# Patient Record
Sex: Male | Born: 1970 | Race: White | Hispanic: No | Marital: Married | Smoking: Current every day smoker
Health system: Southern US, Community
[De-identification: ages and names within clinical notes are randomized; demographics above are authoritative.]

## PROBLEM LIST (undated history)

## (undated) DIAGNOSIS — L0390 Cellulitis, unspecified: Secondary | ICD-10-CM

## (undated) DIAGNOSIS — T7840XA Allergy, unspecified, initial encounter: Secondary | ICD-10-CM

## (undated) DIAGNOSIS — K219 Gastro-esophageal reflux disease without esophagitis: Secondary | ICD-10-CM

## (undated) HISTORY — DX: Gastro-esophageal reflux disease without esophagitis: K21.9

## (undated) HISTORY — PX: ANKLE FRACTURE SURGERY: SHX122

## (undated) HISTORY — PX: JOINT REPLACEMENT: SHX530

## (undated) HISTORY — DX: Allergy, unspecified, initial encounter: T78.40XA

## (undated) HISTORY — PX: WISDOM TOOTH EXTRACTION: SHX21

---

## 1999-03-16 ENCOUNTER — Emergency Department (HOSPITAL_COMMUNITY): Admission: EM | Admit: 1999-03-16 | Discharge: 1999-03-16 | Payer: Self-pay

## 2000-06-20 ENCOUNTER — Encounter: Payer: Self-pay | Admitting: Emergency Medicine

## 2000-06-20 ENCOUNTER — Emergency Department (HOSPITAL_COMMUNITY): Admission: EM | Admit: 2000-06-20 | Discharge: 2000-06-20 | Payer: Self-pay | Admitting: Emergency Medicine

## 2000-09-18 ENCOUNTER — Emergency Department (HOSPITAL_COMMUNITY): Admission: EM | Admit: 2000-09-18 | Discharge: 2000-09-18 | Payer: Self-pay

## 2003-11-25 ENCOUNTER — Emergency Department (HOSPITAL_COMMUNITY): Admission: EM | Admit: 2003-11-25 | Discharge: 2003-11-25 | Payer: Self-pay | Admitting: Emergency Medicine

## 2006-08-12 ENCOUNTER — Emergency Department (HOSPITAL_COMMUNITY): Admission: EM | Admit: 2006-08-12 | Discharge: 2006-08-12 | Payer: Self-pay | Admitting: Family Medicine

## 2010-04-18 ENCOUNTER — Inpatient Hospital Stay (HOSPITAL_COMMUNITY): Admission: EM | Admit: 2010-04-18 | Discharge: 2010-04-19 | Payer: Self-pay | Admitting: Emergency Medicine

## 2010-06-11 ENCOUNTER — Encounter: Admission: RE | Admit: 2010-06-11 | Discharge: 2010-07-15 | Payer: Self-pay | Admitting: Specialist

## 2010-06-12 ENCOUNTER — Emergency Department (HOSPITAL_COMMUNITY): Admission: EM | Admit: 2010-06-12 | Discharge: 2010-06-12 | Payer: Self-pay | Admitting: Emergency Medicine

## 2010-10-16 ENCOUNTER — Emergency Department (HOSPITAL_COMMUNITY)
Admission: EM | Admit: 2010-10-16 | Discharge: 2010-10-16 | Payer: Self-pay | Source: Home / Self Care | Admitting: Emergency Medicine

## 2011-02-02 LAB — CBC
MCHC: 34.3 g/dL (ref 30.0–36.0)
RDW: 12.4 % (ref 11.5–15.5)

## 2011-02-02 LAB — DIFFERENTIAL
Basophils Absolute: 0.1 10*3/uL (ref 0.0–0.1)
Basophils Relative: 0 % (ref 0–1)
Neutro Abs: 11 10*3/uL — ABNORMAL HIGH (ref 1.7–7.7)
Neutrophils Relative %: 82 % — ABNORMAL HIGH (ref 43–77)

## 2011-02-02 LAB — POCT I-STAT, CHEM 8
Calcium, Ion: 1.09 mmol/L — ABNORMAL LOW (ref 1.12–1.32)
Chloride: 107 mEq/L (ref 96–112)
HCT: 50 % (ref 39.0–52.0)
Potassium: 4 mEq/L (ref 3.5–5.1)

## 2011-04-08 ENCOUNTER — Emergency Department (HOSPITAL_BASED_OUTPATIENT_CLINIC_OR_DEPARTMENT_OTHER)
Admission: EM | Admit: 2011-04-08 | Discharge: 2011-04-08 | Disposition: A | Discharge status: Home / Self Care | Payer: BC Managed Care – PPO | Attending: Emergency Medicine | Admitting: Emergency Medicine

## 2011-04-08 ENCOUNTER — Emergency Department (INDEPENDENT_AMBULATORY_CARE_PROVIDER_SITE_OTHER): Payer: BC Managed Care – PPO

## 2011-04-08 DIAGNOSIS — M7989 Other specified soft tissue disorders: Secondary | ICD-10-CM

## 2011-04-08 DIAGNOSIS — M25579 Pain in unspecified ankle and joints of unspecified foot: Secondary | ICD-10-CM | POA: Insufficient documentation

## 2011-04-08 DIAGNOSIS — M79609 Pain in unspecified limb: Secondary | ICD-10-CM

## 2011-04-09 ENCOUNTER — Emergency Department (HOSPITAL_BASED_OUTPATIENT_CLINIC_OR_DEPARTMENT_OTHER)
Admission: EM | Admit: 2011-04-09 | Discharge: 2011-04-09 | Disposition: A | Discharge status: Home / Self Care | Payer: BC Managed Care – PPO | Attending: Emergency Medicine | Admitting: Emergency Medicine

## 2011-04-09 DIAGNOSIS — B353 Tinea pedis: Secondary | ICD-10-CM | POA: Insufficient documentation

## 2011-04-09 DIAGNOSIS — L089 Local infection of the skin and subcutaneous tissue, unspecified: Secondary | ICD-10-CM | POA: Insufficient documentation

## 2011-04-09 LAB — GLUCOSE, CAPILLARY: Glucose-Capillary: 115 mg/dL — ABNORMAL HIGH (ref 70–99)

## 2011-04-11 ENCOUNTER — Inpatient Hospital Stay (HOSPITAL_COMMUNITY)
Admission: AD | Admit: 2011-04-11 | Discharge: 2011-04-14 | DRG: 278 | Disposition: A | Discharge status: Home / Self Care | Payer: BC Managed Care – PPO | Source: Other Acute Inpatient Hospital | Attending: Internal Medicine | Admitting: Internal Medicine

## 2011-04-11 ENCOUNTER — Emergency Department (HOSPITAL_BASED_OUTPATIENT_CLINIC_OR_DEPARTMENT_OTHER)
Admission: EM | Admit: 2011-04-11 | Discharge: 2011-04-11 | Disposition: A | Discharge status: Other Acute Inpatient Hospital | Payer: BC Managed Care – PPO | Source: Home / Self Care | Attending: Emergency Medicine | Admitting: Emergency Medicine

## 2011-04-11 DIAGNOSIS — A4901 Methicillin susceptible Staphylococcus aureus infection, unspecified site: Secondary | ICD-10-CM | POA: Diagnosis present

## 2011-04-11 DIAGNOSIS — R1011 Right upper quadrant pain: Secondary | ICD-10-CM | POA: Diagnosis present

## 2011-04-11 DIAGNOSIS — F17200 Nicotine dependence, unspecified, uncomplicated: Secondary | ICD-10-CM | POA: Diagnosis present

## 2011-04-11 DIAGNOSIS — L02619 Cutaneous abscess of unspecified foot: Principal | ICD-10-CM | POA: Diagnosis present

## 2011-04-11 LAB — DIFFERENTIAL
Basophils Absolute: 0.1 10*3/uL (ref 0.0–0.1)
Basophils Relative: 0 % (ref 0–1)
Eosinophils Relative: 1 % (ref 0–5)
Lymphocytes Relative: 11 % — ABNORMAL LOW (ref 12–46)
Neutro Abs: 13.3 10*3/uL — ABNORMAL HIGH (ref 1.7–7.7)

## 2011-04-11 LAB — CBC
HCT: 41.6 % (ref 39.0–52.0)
RDW: 12.2 % (ref 11.5–15.5)
WBC: 16.7 10*3/uL — ABNORMAL HIGH (ref 4.0–10.5)

## 2011-04-11 LAB — HEPATIC FUNCTION PANEL
AST: 24 U/L (ref 0–37)
Albumin: 3.5 g/dL (ref 3.5–5.2)
Total Bilirubin: 0.6 mg/dL (ref 0.3–1.2)
Total Protein: 6.4 g/dL (ref 6.0–8.3)

## 2011-04-11 LAB — BASIC METABOLIC PANEL
CO2: 21 mEq/L (ref 19–32)
Calcium: 10.1 mg/dL (ref 8.4–10.5)
GFR calc Af Amer: >60 mL/min (ref >60)
GFR calc non Af Amer: >60 mL/min (ref >60)
Sodium: 138 mEq/L (ref 135–145)

## 2011-04-11 LAB — SEDIMENTATION RATE: Sed Rate: 15 mm/hr (ref 0–16)

## 2011-04-12 ENCOUNTER — Inpatient Hospital Stay (HOSPITAL_COMMUNITY): Payer: BC Managed Care – PPO

## 2011-04-12 LAB — COMPREHENSIVE METABOLIC PANEL
Alkaline Phosphatase: 85 U/L (ref 39–117)
BUN: 11 mg/dL (ref 6–23)
CO2: 26 mEq/L (ref 19–32)
Calcium: 9 mg/dL (ref 8.4–10.5)
GFR calc non Af Amer: >60 mL/min (ref >60)
Glucose, Bld: 83 mg/dL (ref 70–99)
Total Protein: 6.6 g/dL (ref 6.0–8.3)

## 2011-04-12 LAB — CBC
MCH: 31.3 pg (ref 26.0–34.0)
MCHC: 35.2 g/dL (ref 30.0–36.0)
Platelets: 187 10*3/uL (ref 150–400)
RDW: 12.2 % (ref 11.5–15.5)

## 2011-04-12 LAB — RAPID URINE DRUG SCREEN, HOSP PERFORMED
Amphetamines: NOT DETECTED
Benzodiazepines: NOT DETECTED
Cocaine: NOT DETECTED
Opiates: POSITIVE — AB
Tetrahydrocannabinol: POSITIVE — AB

## 2011-04-12 LAB — PROTIME-INR
INR: 1.02 (ref 0.00–1.49)
Prothrombin Time: 13.6 seconds (ref 11.6–15.2)

## 2011-04-12 MED ORDER — GADOBENATE DIMEGLUMINE 529 MG/ML IV SOLN
15.0000 mL | Freq: Once | INTRAVENOUS | Status: AC | PRN
  Administered 2011-04-12: 15 mL via INTRAVENOUS

## 2011-04-13 LAB — VANCOMYCIN, TROUGH: Vancomycin Tr: 6.2 ug/mL — ABNORMAL LOW (ref 10.0–20.0)

## 2011-04-13 LAB — CBC
HCT: 39.4 % (ref 39.0–52.0)
Hemoglobin: 13.4 g/dL (ref 13.0–17.0)
MCH: 29.8 pg (ref 26.0–34.0)
MCHC: 34 g/dL (ref 30.0–36.0)
RDW: 12 % (ref 11.5–15.5)

## 2011-04-13 LAB — BASIC METABOLIC PANEL
BUN: 10 mg/dL (ref 6–23)
CO2: 27 mEq/L (ref 19–32)
Creatinine, Ser: 0.63 mg/dL (ref 0.4–1.5)
Glucose, Bld: 95 mg/dL (ref 70–99)
Sodium: 136 mEq/L (ref 135–145)

## 2011-04-15 LAB — WOUND CULTURE

## 2011-04-15 NOTE — Consult Note (Signed)
  NAMESAMI, FROH               ACCOUNT NO.:  [id number]  MEDICAL RECORD NO.:  [id number]           PATIENT TYPE:  E  LOCATION:  [facility]                         FACILITY:  [facility]  PHYSICIAN:  [name], M.D.DATE OF BIRTH:  08/30/1971  DATE OF CONSULTATION:  04/12/2011 DATE OF DISCHARGE:  04/11/2011                                CONSULTATION   ADDENDUM: Today Christian Peterson had MRI scan done of his right foot.  I discussed this with Dr. Alcide Clever of  Radiology.  He did describe a small sepsis formation 3rd web space but no foreign body.  I discussed this with the patient, his mother, and his girlfriend who recommended irrigation and debridement and wished to proceed.  PROCEDURE:  Lidocaine 2% anesthetization, Betadine prep irrigation and debridement.  Removed 2-3 cc of frank pus from the 3rd web space and then copious irrigation of the abscess pocket was done.  Gram stain and culture were sent initially and then it was packed with iodoform gauze and dressings applied.  IMPRESSION:  Abscess formation, right 3rd web space with secondary cellulitis.  RECOMMENDATIONS:  Irrigation and debridement as noted above.  Continue IV vancomycin and Zosyn.  Check on Gram stain tomorrow.  He has doxycycline and Keflex at home.  If at that point in time tomorrow he feels much better and his foot looks good, he can go home on p.o. antibiotics and follow with cultures as necessary and change antibiotics in the office if necessary.  He is also recommended to stay out of work and elevate as much as possible.  All questions were encouraged and answered to the patient.  His girlfriend and his mother were with him today.          ______________________________ [name], M.D.     RAC/MEDQ  D:  04/12/2011  T:  04/13/2011  Job:  [id number]  Electronically Signed by Eugenia Mcalpine M.D. on 04/15/2011 01:03:46 PM
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
## 2011-04-17 LAB — CULTURE, BLOOD (ROUTINE X 2)
Culture  Setup Time: 201205261625
Culture: NO GROWTH
Culture: NO GROWTH

## 2011-04-23 NOTE — Discharge Summary (Signed)
Christian Peterson, Christian Peterson               ACCOUNT NO.:  [id number]  MEDICAL RECORD NO.:  [id number]           PATIENT TYPE:  I  LOCATION:  1614                         FACILITY:  [facility]  PHYSICIAN:  [name], MD     DATE OF BIRTH:  02-14-1971  DATE OF ADMISSION:  04/11/2011 DATE OF DISCHARGE:  04/14/2011                              DISCHARGE SUMMARY   PRIMARY CARE PHYSICIAN:  The patient does not have a primary care physician.  The patient was seen in consultation in the hospital by Dr. Thomasena Edis with [facility]  IMAGING STUDIES:  Imaging studies done include: 1. Right foot film which showed no acute process. 2. He had x-ray of the eyes to rule out foreign body which was ruled     out. 3. He had MRI of the right foot which showed a small abscess of the     distal plantar ball of the foot between the third and fourth toes,     surrounding cellulitis was seen.  No foreign body was noted.     Equivocal myositis was seen as well in the dorsal foot musculature.  PROCEDURES:  Included bedside incision and drainage of the above- mentioned abscess done by Dr. Thomasena Edis.  DISCHARGE DIAGNOSES:  Cellulitis and abscess of right foot.  Staph aureus full sensitivity is not fully available yet.  BRIEF HOSPITAL COURSE:  Briefly, this is a 40 year old Caucasian male who does not have any medical problems per se who presented to the hospital with complaints of right foot pain.  He actually presented to the [facility] ED.  This was his third visit to the ED for the same complaint.  The patient was subsequently admitted, started on vancomycin and Zosyn for cellulitis.  However, the pain was out of proportion to the findings, so MRI was done which showed the abscess. This was drained by the bedside by Dr. Thomasena Edis.  The patient has done well over the last 2 days.  His cultures from the wound are growing abundant staph aureus, full sensitivities are not available.  We  could assume that this could be MRSA.  So Dr. Thomasena Edis recommended the patient go home on doxycycline and Keflex which will be prescribed.  The patient may have these medications at home as well.  He has been afebrile.  His white count initially was 04540 and has normalized.  The foot has been dressed.  Instructions have been provided by Dr. Thomasena Edis to the patient. The patient will see the orthopedic physician on Friday morning in his office for further instructions.  DISCHARGE MEDICATIONS:  Discharge medications are as follows: 1. Doxycycline 100 mg p.o. b.i.d. for 10 days. 2. Keflex 500 mg 4 times daily for 10 days. 3. Nicotine patch 14 mg transdermally daily. 4. Oxycodone 5 mg one to 3 tablets q.3 h p.r.n. as needed for pain, 30     tablets prescribed, should hold until Friday when he sees Dr.     Thomasena Edis.  On the day of discharge, the patient is feeling well.  He has been ambulating using the wooden foot and has been tolerating that  quite well, has not required any pain medications since 6 in the morning.  His vital signs are stable.  He is afebrile.  His lungs are clear.  His right foot is covered in a dressing.  Follow up with Dr. Thomasena Edis Friday morning, the patient will call for appointment.  He has been instructed that if the pain gets worse, he develops fever or chills, he needs to seek attention immediately.  DIET:  Regular.  PHYSICAL ACTIVITY:  Increase activity slowly.  He has been given a note for work as well.  Smoking cessation was emphasized to the patient.  [name], MD     GK/MEDQ  D:  04/14/2011  T:  04/14/2011  Job:  [id number]  cc:   [name], M.D. Fax: [phone]  Electronically Signed by [name] MD on 04/23/2011 11:16:58 PM
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
## 2011-11-04 ENCOUNTER — Emergency Department (INDEPENDENT_AMBULATORY_CARE_PROVIDER_SITE_OTHER): Payer: BC Managed Care – PPO

## 2011-11-04 ENCOUNTER — Encounter: Payer: Self-pay | Admitting: *Deleted

## 2011-11-04 ENCOUNTER — Emergency Department (HOSPITAL_BASED_OUTPATIENT_CLINIC_OR_DEPARTMENT_OTHER)
Admission: EM | Admit: 2011-11-04 | Discharge: 2011-11-04 | Disposition: A | Discharge status: Home / Self Care | Payer: BC Managed Care – PPO | Attending: Emergency Medicine | Admitting: Emergency Medicine

## 2011-11-04 DIAGNOSIS — R05 Cough: Secondary | ICD-10-CM

## 2011-11-04 DIAGNOSIS — R059 Cough, unspecified: Secondary | ICD-10-CM | POA: Insufficient documentation

## 2011-11-04 DIAGNOSIS — B349 Viral infection, unspecified: Secondary | ICD-10-CM

## 2011-11-04 DIAGNOSIS — R0989 Other specified symptoms and signs involving the circulatory and respiratory systems: Secondary | ICD-10-CM

## 2011-11-04 MED ORDER — HYDROCOD POLST-CHLORPHEN POLST 10-8 MG/5ML PO LQCR: 5.0000 mL | Freq: Two times a day (BID) | ORAL | Status: DC | PRN

## 2011-11-04 NOTE — ED Provider Notes (Addendum)
History     CSN: [id number] Arrival date & time: 11/04/2011  2:20 PM   First MD Initiated Contact with Patient 11/04/11 1424      Chief Complaint  Patient presents with  . Influenza    (Consider location/radiation/quality/duration/timing/severity/associated sxs/prior treatment) Patient is a 40 y.o. male presenting with flu symptoms. The history is provided by the patient. No language interpreter was used.  Influenza This is a new problem. The current episode started yesterday. The problem occurs constantly. The problem has been unchanged. Associated symptoms include coughing, a fever, headaches, myalgias and a sore throat. Pertinent negatives include no vomiting. The symptoms are aggravated by nothing. He has tried NSAIDs and acetaminophen for the symptoms. The treatment provided mild relief.    History reviewed. No pertinent past medical history.  Past Surgical History  Procedure Date  . Joint replacement     History reviewed. No pertinent family history.  History  Substance Use Topics  . Smoking status: Current Everyday Smoker -- 1.0 packs/day  . Smokeless tobacco: Not on file  . Alcohol Use: Yes      Review of Systems  Constitutional: Positive for fever.  HENT: Positive for sore throat.   Respiratory: Positive for cough.   Gastrointestinal: Negative for vomiting.  Musculoskeletal: Positive for myalgias.  Neurological: Positive for headaches.  All other systems reviewed and are negative.    Allergies  Review of patient's allergies indicates no known allergies.  Home Medications  No current outpatient prescriptions on file.  BP 132/75  Pulse 105  Temp(Src) 98.7 F (37.1 C) (Oral)  Resp 20  Ht 5\' 8"  (1.727 m)  Wt 150 lb (68.04 kg)  BMI 22.81 kg/m2  SpO2 97%  Physical Exam  Nursing note reviewed. Constitutional: He is oriented to person, place, and time. He appears well-developed and well-nourished.  HENT:  Head: Normocephalic and atraumatic.    Right Ear: External ear normal.  Left Ear: External ear normal.  Mouth/Throat: Posterior oropharyngeal erythema present.  Neck: Normal range of motion. Neck supple.  Cardiovascular: Normal rate and regular rhythm.   Pulmonary/Chest: He has rales.  Musculoskeletal: Normal range of motion.  Neurological: He is alert and oriented to person, place, and time.  Skin: Skin is warm and dry.  Psychiatric: He has a normal mood and affect.    ED Course  Procedures (including critical care time)  Labs Reviewed - No data to display Dg Chest 2 View  11/04/2011  *RADIOLOGY REPORT*  Clinical Data: Cough, congestion  CHEST - 2 VIEW  Comparison: None.  Findings: Cardiomediastinal silhouette is unremarkable.  No acute infiltrate or pleural effusion.  No pulmonary edema.  Mild hyperinflation.  Mild degenerative changes lower thoracic spine.  IMPRESSION: No active disease.  Mild hyperinflation.  Original Report Authenticated By: Natasha Mead, M.D.     1. Viral illness       MDM  Likely ili:pt doesn't want tamiflu:will sent home on symptomatic treatment        Teressa Lower, NP 11/04/11 1456  Teressa Lower, NP 11/04/11 1542

## 2011-11-04 NOTE — ED Provider Notes (Signed)
Medical screening examination/treatment/procedure(s) were performed by non-physician practitioner and as supervising physician I was immediately available for consultation/collaboration.   Apolo Cutshaw A. Tajanae Guilbault, MD 11/04/11 1518 

## 2011-11-04 NOTE — ED Notes (Signed)
Pt c/i flu like symptoms x 2 days

## 2011-11-05 NOTE — ED Provider Notes (Signed)
Medical screening examination/treatment/procedure(s) were performed by non-physician practitioner and as supervising physician I was immediately available for consultation/collaboration.   Lilyonna Steidle A. Joeanthony Seeling, MD 11/05/11 0701 

## 2012-06-01 ENCOUNTER — Encounter: Payer: Self-pay | Admitting: Dietician

## 2012-06-01 NOTE — Progress Notes (Signed)
Brief Out-patient Oncology Nutrition Note  Reason: Patient attended breast cancer nutrition class.   I have educated the patient on plant-based diet. I also discussed and provided nutrition handouts and research based evidence on breast cancer nutrition. We discussed nutrition management for symptoms associated with treatment. The class lasted a duration of 1 hour. The patient's asked good questions and were without any further nutrition related questions or concerns. RD contact information was provided. Patient's were instructed to contact RD for future nutrition questions or concerns.   RD available for nutrition needs.   Christian Peterson [phone] 

## 2013-06-25 ENCOUNTER — Encounter (HOSPITAL_BASED_OUTPATIENT_CLINIC_OR_DEPARTMENT_OTHER): Payer: Self-pay | Admitting: *Deleted

## 2013-06-25 ENCOUNTER — Emergency Department (HOSPITAL_BASED_OUTPATIENT_CLINIC_OR_DEPARTMENT_OTHER)
Admission: EM | Admit: 2013-06-25 | Discharge: 2013-06-25 | Disposition: A | Discharge status: Home / Self Care | Payer: Managed Care, Other (non HMO) | Attending: Emergency Medicine | Admitting: Emergency Medicine

## 2013-06-25 DIAGNOSIS — L03119 Cellulitis of unspecified part of limb: Secondary | ICD-10-CM | POA: Insufficient documentation

## 2013-06-25 DIAGNOSIS — R21 Rash and other nonspecific skin eruption: Secondary | ICD-10-CM | POA: Insufficient documentation

## 2013-06-25 DIAGNOSIS — F17200 Nicotine dependence, unspecified, uncomplicated: Secondary | ICD-10-CM | POA: Insufficient documentation

## 2013-06-25 DIAGNOSIS — L02619 Cutaneous abscess of unspecified foot: Secondary | ICD-10-CM | POA: Insufficient documentation

## 2013-06-25 DIAGNOSIS — L02612 Cutaneous abscess of left foot: Secondary | ICD-10-CM

## 2013-06-25 HISTORY — DX: Cellulitis, unspecified: L03.90

## 2013-06-25 MED ORDER — MORPHINE SULFATE 4 MG/ML IJ SOLN
6.0000 mg | Freq: Once | INTRAMUSCULAR | Status: AC
  Administered 2013-06-25: 6 mg via INTRAMUSCULAR

## 2013-06-25 MED ORDER — MORPHINE SULFATE 4 MG/ML IJ SOLN
4.0000 mg | Freq: Once | INTRAMUSCULAR | Status: AC
  Administered 2013-06-25: 4 mg via INTRAVENOUS

## 2013-06-25 MED ORDER — CEPHALEXIN 500 MG PO CAPS: 500.0000 mg | ORAL_CAPSULE | Freq: Four times a day (QID) | ORAL | Status: DC

## 2013-06-25 MED ORDER — OXYCODONE-ACETAMINOPHEN 5-325 MG PO TABS: 1.0000 | ORAL_TABLET | Freq: Four times a day (QID) | ORAL | Status: DC | PRN

## 2013-06-25 MED ORDER — MORPHINE SULFATE 4 MG/ML IJ SOLN
INTRAMUSCULAR | Status: AC
  Administered 2013-06-25: 6 mg via INTRAMUSCULAR
  Filled 2013-06-25: qty 1

## 2013-06-25 MED ORDER — HYDROCODONE-ACETAMINOPHEN 5-325 MG PO TABS: 2.0000 | ORAL_TABLET | ORAL | Status: DC | PRN

## 2013-06-25 MED ORDER — MORPHINE SULFATE 2 MG/ML IJ SOLN
INTRAMUSCULAR | Status: AC
  Filled 2013-06-25: qty 1

## 2013-06-25 MED ORDER — LIDOCAINE HCL (PF) 1 % IJ SOLN
5.0000 mL | Freq: Once | INTRAMUSCULAR | Status: DC
  Filled 2013-06-25: qty 5

## 2013-06-25 MED ORDER — MORPHINE SULFATE 4 MG/ML IJ SOLN
INTRAMUSCULAR | Status: AC
  Filled 2013-06-25: qty 1

## 2013-06-25 NOTE — ED Provider Notes (Signed)
CSN: [id number]     Arrival date & time 06/25/13  1050 History     First MD Initiated Contact with Patient 06/25/13 1127     Chief Complaint  Patient presents with  . Foot Pain   (Consider location/radiation/quality/duration/timing/severity/associated sxs/prior Treatment) HPI Comments: Pain of R foot, worse at base of 4th toe on plantar surface at location of prior abscess formation. No fever, chills, n/v.  No known injury.  Patient is a 42 y.o. male presenting with extremity pain. The history is provided by the patient and a friend. No language interpreter was used.  Extremity Pain This is a new problem. The current episode started 2 days ago. The problem occurs constantly. The problem has been gradually worsening. Pertinent negatives include no chest pain, no abdominal pain, no headaches and no shortness of breath. The symptoms are aggravated by standing and walking. The symptoms are relieved by lying down. He has tried nothing for the symptoms. The treatment provided no relief.    Past Medical History  Diagnosis Date  . Cellulitis     R foot   Past Surgical History  Procedure Laterality Date  . Joint replacement      L ankle   No family history on file. History  Substance Use Topics  . Smoking status: Current Every Day Smoker -- 1.00 packs/day  . Smokeless tobacco: Not on file  . Alcohol Use: Yes    Review of Systems  Constitutional: Negative for fever, activity change, appetite change and fatigue.  HENT: Negative for congestion, facial swelling, rhinorrhea and trouble swallowing.   Eyes: Negative for photophobia and pain.  Respiratory: Negative for cough, chest tightness and shortness of breath.   Cardiovascular: Negative for chest pain and leg swelling.  Gastrointestinal: Negative for nausea, vomiting, abdominal pain, diarrhea and constipation.  Endocrine: Negative for polydipsia and polyuria.  Genitourinary: Negative for dysuria, urgency, decreased urine volume and  difficulty urinating.  Musculoskeletal: Negative for back pain and gait problem.  Skin: Positive for rash (L foot). Negative for color change and wound.  Allergic/Immunologic: Negative for immunocompromised state.  Neurological: Negative for dizziness, facial asymmetry, speech difficulty, weakness, numbness and headaches.  Psychiatric/Behavioral: Negative for confusion, decreased concentration and agitation.    Allergies  Review of patient's allergies indicates no known allergies.  Home Medications   Current Outpatient Rx  Name  Route  Sig  Dispense  Refill  . cephALEXin (KEFLEX) 500 MG capsule   Oral   Take 1 capsule (500 mg total) by mouth 4 (four) times daily.   28 capsule   0   . chlorpheniramine-HYDROcodone (TUSSIONEX PENNKINETIC ER) 10-8 MG/5ML LQCR   Oral   Take 5 mLs by mouth every 12 (twelve) hours as needed.   140 mL   0   . oxyCODONE-acetaminophen (PERCOCET) 5-325 MG per tablet   Oral   Take 1 tablet by mouth every 6 (six) hours as needed for pain.   15 tablet   0    BP 127/84  Pulse 85  Temp(Src) 97.5 F (36.4 C) (Oral)  Resp 16  Ht 5\' 8"  (1.727 m)  Wt 160 lb (72.576 kg)  BMI 24.33 kg/m2  SpO2 98% Physical Exam  Constitutional: He is oriented to person, place, and time. He appears well-developed and well-nourished. No distress.  HENT:  Head: Normocephalic and atraumatic.  Mouth/Throat: No oropharyngeal exudate.  Eyes: Pupils are equal, round, and reactive to light.  Neck: Normal range of motion. Neck supple.  Cardiovascular: Normal rate, regular  rhythm and normal heart sounds.  Exam reveals no gallop and no friction rub.   No murmur heard. Pulmonary/Chest: Effort normal and breath sounds normal. No respiratory distress. He has no wheezes. He has no rales.  Abdominal: Soft. Bowel sounds are normal. He exhibits no distension and no mass. There is no tenderness. There is no rebound and no guarding.  Musculoskeletal: Normal range of motion. He exhibits no  edema and no tenderness.       Feet:  Pinpoint tenderness on plantar surface of foot just prox to base of 4th toe.    Neurological: He is alert and oriented to person, place, and time.  Skin: Skin is warm and dry.  Psychiatric: He has a normal mood and affect.    ED Course   Procedures (including critical care time)  Labs Reviewed - No data to display No results found. 1. Foot abscess, left    INCISION AND DRAINAGE Performed by: Toy Cookey, E Consent: Verbal consent obtained. Risks and benefits: risks, benefits and alternatives were discussed Time out performed prior to procedure Type: abscess Body area: R foot Anesthesia: local infiltration Incision was made with a scalpel. Local anesthetic: lidocaine 1%  Anesthetic total: 5 ml Complexity: complex Blunt dissection to break up loculations Drainage: purulent Drainage amount: 2-3cc Packing material: 1/4 in iodoform Patient tolerance: Patient tolerated the procedure well with no immediate complications.   MDM  Pt is a 42 y.o. male with Pmhx as above who presents with pain, erythema, warmth of R foot.  Hx of cellulitis & abscess of plantar foot in past. VSS, pt in NAD.  Pt has cellulitis of R foot w/ TTP just prox to base of R 4th toe on plantar surface at location of prior abscess.  Bedside US showed small superficial fluid collection at this point.  I&D done w/ purulent fluid returned.  Area packed.  Pt placed on PO keflex, with return in 2 days for recheck as he has no PCP.  Return precautions given for new or worsening symptoms including fever, worsening redness, swelling, pain.   1. Foot abscess, left       Shanna Cisco, MD 06/25/13 1821

## 2013-06-25 NOTE — ED Notes (Signed)
Patient states his R foot has been experiencing swelling/throbbing/redness for the last two days, took tylenol no relief. Concerned because he had a foot infection in the past on the same foot

## 2013-06-27 ENCOUNTER — Emergency Department (INDEPENDENT_AMBULATORY_CARE_PROVIDER_SITE_OTHER)
Admission: EM | Admit: 2013-06-27 | Discharge: 2013-06-27 | Disposition: A | Discharge status: Home / Self Care | Payer: Managed Care, Other (non HMO) | Source: Home / Self Care | Attending: Emergency Medicine | Admitting: Emergency Medicine

## 2013-06-27 ENCOUNTER — Encounter: Payer: Self-pay | Admitting: Emergency Medicine

## 2013-06-27 DIAGNOSIS — L02419 Cutaneous abscess of limb, unspecified: Secondary | ICD-10-CM

## 2013-06-27 DIAGNOSIS — L03119 Cellulitis of unspecified part of limb: Secondary | ICD-10-CM

## 2013-06-27 NOTE — ED Provider Notes (Signed)
CSN: [id number]     Arrival date & time 06/27/13  1216 History     First MD Initiated Contact with Patient 06/27/13 1220     Chief Complaint  Patient presents with  . Wound Check   (Consider location/radiation/quality/duration/timing/severity/associated sxs/prior Treatment) HPI Here for recheck wound plantar aspect right foot after incision and drainage 2 days ago at med center [facility] emergency room. He was placed on cephalexin and iodoform gauze was placed. I reviewed the records and double checked, and there is no indication that any culture was sent.  Since that procedure 2 days ago, he reports that his right foot pain is greatly decreased. No bleeding or drainage. No fever, nausea or vomiting. He has only needed one Percocet in the past 24 hours for pain. Past Medical History  Diagnosis Date  . Cellulitis     R foot   Past Surgical History  Procedure Laterality Date  . Joint replacement      L ankle   No family history on file. History  Substance Use Topics  . Smoking status: Current Every Day Smoker -- 1.00 packs/day  . Smokeless tobacco: Not on file  . Alcohol Use: Yes    Review of Systems  All other systems reviewed and are negative.    Allergies  Review of patient's allergies indicates not on file.  Home Medications   Current Outpatient Rx  Name  Route  Sig  Dispense  Refill  . cephALEXin (KEFLEX) 500 MG capsule   Oral   Take 1 capsule (500 mg total) by mouth 4 (four) times daily.   28 capsule   0   . chlorpheniramine-HYDROcodone (TUSSIONEX PENNKINETIC ER) 10-8 MG/5ML LQCR   Oral   Take 5 mLs by mouth every 12 (twelve) hours as needed.   140 mL   0   . oxyCODONE-acetaminophen (PERCOCET) 5-325 MG per tablet   Oral   Take 1 tablet by mouth every 6 (six) hours as needed for pain.   15 tablet   0    BP 149/88  Pulse 88  Temp(Src) 98.3 F (36.8 C) (Oral)  Ht 5\' 8"  (1.727 m)  Wt 160 lb (72.576 kg)  BMI 24.33 kg/m2  SpO2 96% Physical Exam   Nursing note and vitals reviewed. Constitutional: He is oriented to person, place, and time. He appears well-developed and well-nourished. No distress.  HENT:  Head: Normocephalic and atraumatic.  Eyes: Conjunctivae and EOM are normal. Pupils are equal, round, and reactive to light. No scleral icterus.  Neck: Normal range of motion.  Cardiovascular: Normal rate.   Pulmonary/Chest: Effort normal.  Abdominal: He exhibits no distension.  Musculoskeletal: Normal range of motion.  Plantar aspect,between Rt 4th and 5th toes, there is an open wound 4 x 8 mm. Shonna Chock is in place, which I removed without problems. Wound is only minimally tender. No redness or swelling . No drainage. No red streaks. Capillary refill wnl. Neurovascular intact. No foreign body. No bony tenderness    Neurological: He is alert and oriented to person, place, and time.  Skin: Skin is warm.  Psychiatric: He has a normal mood and affect.    ED Course   Procedures (including critical care time)  Labs Reviewed - No data to display No results found. 1. Cellulitis and abscess of leg     MDM  I removed the wick. Wound cleansed with antibacterial soap and rinsed. With sterile water. No foreign body seen. Wound redressed. Wound care discussed, see AVS for  details. Finish the cephalexin as prescribed. Red flags discussed. Followup with PCP. Also advised to have BP rechecked with PCP within the next one to 2 months.-He voiced understanding and agreement with the above plans  Lajean Manes, MD 06/27/13 1315

## 2013-06-27 NOTE — ED Notes (Signed)
Wound Check, abscess I&D from Sunday between Rt 4th and 5th toes

## 2013-10-20 ENCOUNTER — Encounter (HOSPITAL_BASED_OUTPATIENT_CLINIC_OR_DEPARTMENT_OTHER): Payer: Self-pay | Admitting: Emergency Medicine

## 2013-10-20 ENCOUNTER — Emergency Department (HOSPITAL_BASED_OUTPATIENT_CLINIC_OR_DEPARTMENT_OTHER): Payer: Managed Care, Other (non HMO)

## 2013-10-20 ENCOUNTER — Emergency Department (HOSPITAL_BASED_OUTPATIENT_CLINIC_OR_DEPARTMENT_OTHER)
Admission: EM | Admit: 2013-10-20 | Discharge: 2013-10-20 | Disposition: A | Discharge status: Home / Self Care | Payer: Managed Care, Other (non HMO) | Attending: Emergency Medicine | Admitting: Emergency Medicine

## 2013-10-20 DIAGNOSIS — R109 Unspecified abdominal pain: Secondary | ICD-10-CM

## 2013-10-20 DIAGNOSIS — F17200 Nicotine dependence, unspecified, uncomplicated: Secondary | ICD-10-CM | POA: Insufficient documentation

## 2013-10-20 DIAGNOSIS — R110 Nausea: Secondary | ICD-10-CM | POA: Insufficient documentation

## 2013-10-20 DIAGNOSIS — R61 Generalized hyperhidrosis: Secondary | ICD-10-CM | POA: Insufficient documentation

## 2013-10-20 DIAGNOSIS — Z792 Long term (current) use of antibiotics: Secondary | ICD-10-CM | POA: Insufficient documentation

## 2013-10-20 DIAGNOSIS — K5900 Constipation, unspecified: Secondary | ICD-10-CM | POA: Insufficient documentation

## 2013-10-20 DIAGNOSIS — Z872 Personal history of diseases of the skin and subcutaneous tissue: Secondary | ICD-10-CM | POA: Insufficient documentation

## 2013-10-20 LAB — URINALYSIS, ROUTINE W REFLEX MICROSCOPIC
Hgb urine dipstick: NEGATIVE
Nitrite: NEGATIVE
Protein, ur: NEGATIVE mg/dL
Specific Gravity, Urine: 1.022 (ref 1.005–1.030)
Urobilinogen, UA: 0.2 mg/dL (ref 0.0–1.0)

## 2013-10-20 LAB — COMPREHENSIVE METABOLIC PANEL
ALT: 19 U/L (ref 0–53)
AST: 19 U/L (ref 0–37)
Albumin: 4 g/dL (ref 3.5–5.2)
Calcium: 9.6 mg/dL (ref 8.4–10.5)
Potassium: 4 mEq/L (ref 3.5–5.1)
Sodium: 139 mEq/L (ref 135–145)
Total Protein: 7.6 g/dL (ref 6.0–8.3)

## 2013-10-20 LAB — CBC WITH DIFFERENTIAL/PLATELET
Basophils Absolute: 0 10*3/uL (ref 0.0–0.1)
Eosinophils Absolute: 0.2 10*3/uL (ref 0.0–0.7)
Eosinophils Relative: 3 % (ref 0–5)
Lymphocytes Relative: 12 % (ref 12–46)
MCH: 31.4 pg (ref 26.0–34.0)
MCV: 91.5 fL (ref 78.0–100.0)
Neutrophils Relative %: 70 % (ref 43–77)
Platelets: 191 10*3/uL (ref 150–400)
RDW: 12.4 % (ref 11.5–15.5)
WBC: 6.9 10*3/uL (ref 4.0–10.5)

## 2013-10-20 MED ORDER — ONDANSETRON HCL 4 MG/2ML IJ SOLN
4.0000 mg | Freq: Once | INTRAMUSCULAR | Status: AC
  Administered 2013-10-20: 4 mg via INTRAVENOUS

## 2013-10-20 MED ORDER — MORPHINE SULFATE 4 MG/ML IJ SOLN
INTRAMUSCULAR | Status: AC
  Filled 2013-10-20: qty 1

## 2013-10-20 MED ORDER — IOHEXOL 300 MG/ML  SOLN
100.0000 mL | Freq: Once | INTRAMUSCULAR | Status: AC | PRN
  Administered 2013-10-20: 100 mL via INTRAVENOUS

## 2013-10-20 MED ORDER — ONDANSETRON HCL 4 MG/2ML IJ SOLN
INTRAMUSCULAR | Status: AC
  Filled 2013-10-20: qty 2

## 2013-10-20 MED ORDER — IOHEXOL 300 MG/ML  SOLN
50.0000 mL | Freq: Once | INTRAMUSCULAR | Status: AC | PRN
  Administered 2013-10-20: 50 mL via ORAL

## 2013-10-20 MED ORDER — SODIUM CHLORIDE 0.9 % IV BOLUS (SEPSIS)
1000.0000 mL | Freq: Once | INTRAVENOUS | Status: AC
  Administered 2013-10-20: 1000 mL via INTRAVENOUS

## 2013-10-20 MED ORDER — HYDROMORPHONE HCL PF 1 MG/ML IJ SOLN
1.0000 mg | Freq: Once | INTRAMUSCULAR | Status: AC
  Administered 2013-10-20: 1 mg via INTRAVENOUS
  Filled 2013-10-20: qty 1

## 2013-10-20 MED ORDER — MORPHINE SULFATE 4 MG/ML IJ SOLN
4.0000 mg | Freq: Once | INTRAMUSCULAR | Status: AC
  Administered 2013-10-20: 4 mg via INTRAVENOUS

## 2013-10-20 MED ORDER — POLYETHYLENE GLYCOL 3350 17 G PO PACK: 17.0000 g | PACK | Freq: Two times a day (BID) | ORAL | Status: DC

## 2013-10-20 NOTE — ED Notes (Signed)
Patient transported to CT 

## 2013-10-20 NOTE — ED Provider Notes (Signed)
CSN: [id number]     Arrival date & time 10/20/13  0827 History   First MD Initiated Contact with Patient 10/20/13 0840     Chief Complaint  Patient presents with  . Abdominal Pain   (Consider location/radiation/quality/duration/timing/severity/associated sxs/prior Treatment) Patient is a 42 y.o. male presenting with abdominal pain.  Abdominal Pain Pain location: Lower. Pain quality: cramping and sharp   Pain radiates to:  Does not radiate Pain severity:  Severe Onset quality:  Gradual Duration:  2 days Timing:  Intermittent Progression:  Worsening Chronicity:  New Context comment:  Had a tooth extracted a few days ago Relieved by:  Nothing Worsened by:  Nothing tried Ineffective treatments: Percocet. Associated symptoms: constipation and nausea   Associated symptoms: no chest pain, no cough, no diarrhea, no dysuria, no fever (Has had sweats), no hematuria, no shortness of breath and no vomiting     Past Medical History  Diagnosis Date  . Cellulitis     R foot   Past Surgical History  Procedure Laterality Date  . Joint replacement      L ankle   No family history on file. History  Substance Use Topics  . Smoking status: Current Every Day Smoker -- 1.00 packs/day  . Smokeless tobacco: Not on file  . Alcohol Use: Yes     Comment: Drinks 5 out of 7 days.  Mostly beer.  Some liquor.    Review of Systems  Constitutional: Negative for fever (Has had sweats).  HENT: Negative for congestion.   Respiratory: Negative for cough and shortness of breath.   Cardiovascular: Negative for chest pain.  Gastrointestinal: Positive for nausea, abdominal pain and constipation. Negative for vomiting and diarrhea.  Genitourinary: Negative for dysuria and hematuria.  All other systems reviewed and are negative.    Allergies  Review of patient's allergies indicates no known allergies.  Home Medications   Current Outpatient Rx  Name  Route  Sig  Dispense  Refill  . cephALEXin  (KEFLEX) 500 MG capsule   Oral   Take 1 capsule (500 mg total) by mouth 4 (four) times daily.   28 capsule   0   . chlorpheniramine-HYDROcodone (TUSSIONEX PENNKINETIC ER) 10-8 MG/5ML LQCR   Oral   Take 5 mLs by mouth every 12 (twelve) hours as needed.   140 mL   0   . oxyCODONE-acetaminophen (PERCOCET) 5-325 MG per tablet   Oral   Take 1 tablet by mouth every 6 (six) hours as needed for pain.   15 tablet   0    BP 136/77  Pulse 87  Temp(Src) 97.8 F (36.6 C) (Oral)  Resp 16  Ht 5\' 8"  (1.727 m)  Wt 155 lb (70.308 kg)  BMI 23.57 kg/m2  SpO2 97% Physical Exam  Nursing note and vitals reviewed. Constitutional: He is oriented to person, place, and time. He appears well-developed and well-nourished. No distress.  HENT:  Head: Normocephalic and atraumatic.  Mouth/Throat: Oropharynx is clear and moist.  Eyes: Conjunctivae are normal. Pupils are equal, round, and reactive to light. No scleral icterus.  Neck: Neck supple.  Cardiovascular: Normal rate, regular rhythm, normal heart sounds and intact distal pulses.   No murmur heard. Pulmonary/Chest: Effort normal and breath sounds normal. No stridor. No respiratory distress. He has no wheezes. He has no rales.  Abdominal: Soft. He exhibits no distension. There is no tenderness.  Genitourinary: Rectal exam shows tenderness. Rectal exam shows no external hemorrhoid, no internal hemorrhoid and no mass. Prostate is  not enlarged and not tender.  No fecal impaction.  Minimal light brown stool in rectal vault.  Musculoskeletal: Normal range of motion. He exhibits no edema.  Neurological: He is alert and oriented to person, place, and time.  Skin: Skin is warm and dry. No rash noted.  Psychiatric: He has a normal mood and affect. His behavior is normal.    ED Course  Procedures (including critical care time) Labs Review Labs Reviewed  URINALYSIS, ROUTINE W REFLEX MICROSCOPIC - Abnormal; Notable for the following:    APPearance CLOUDY  (*)    Ketones, ur 15 (*)    All other components within normal limits  CBC WITH DIFFERENTIAL - Abnormal; Notable for the following:    Monocytes Relative 14 (*)    All other components within normal limits  COMPREHENSIVE METABOLIC PANEL - Abnormal; Notable for the following:    Glucose, Bld 108 (*)    All other components within normal limits  OCCULT BLOOD X 1 CARD TO LAB, STOOL - Abnormal; Notable for the following:    Fecal Occult Bld POSITIVE (*)    All other components within normal limits   Imaging Review Ct Abdomen Pelvis W Contrast  10/20/2013   CLINICAL DATA:  Abdominal pain.  EXAM: CT ABDOMEN AND PELVIS WITH CONTRAST  TECHNIQUE: Multidetector CT imaging of the abdomen and pelvis was performed using the standard protocol following bolus administration of intravenous contrast.  CONTRAST:  50mL OMNIPAQUE IOHEXOL 300 MG/ML SOLN, OMNIPAQUE IOHEXOL 300 MG/ML SOLN  COMPARISON:  None.  FINDINGS: Visualized lung bases appear normal. The liver, spleen and pancreas appear normal. No gallstones are noted. Adrenal glands and kidneys appear normal. No ureteral or renal calculi are noted. The appendix appears normal. There is no evidence of bowel obstruction. No abnormal fluid collection is noted. Urinary bladder appears normal. No osseous abnormality is noted. No definite adenopathy is noted.  IMPRESSION: No significant abnormality seen in the abdomen or pelvis.   Electronically Signed   By: Roque Lias M.D.   On: 10/20/2013 10:31  All radiology studies independently viewed by me.     EKG Interpretation   None       MDM   1. Abdominal pain   2. Constipation    Cramping abdominal pain for 2 days.  Well-appearing, abdomen nontender. However, patient doubled over in pain at a few points during exam.  Plan IV, fluids, pain control, CT abdomen and pelvis.  CT negative for acute pathology. Does show moderate stool burden. I think this is likely patient's source of pain as he reports not  eating is normal high fiber diet since his tooth extraction a few days ago.  Will trial MiraLax and advised PCP followup and have given return precautions  Candyce Churn, MD 10/20/13 1046

## 2013-10-20 NOTE — ED Notes (Signed)
Low abd pain x3 days. Had Tooth extraction Tues and didn't eat. Abd pain since that day.

## 2013-10-20 NOTE — ED Notes (Signed)
MD at bedside. 

## 2016-07-02 ENCOUNTER — Encounter (HOSPITAL_BASED_OUTPATIENT_CLINIC_OR_DEPARTMENT_OTHER): Payer: Self-pay

## 2016-07-02 ENCOUNTER — Emergency Department (HOSPITAL_BASED_OUTPATIENT_CLINIC_OR_DEPARTMENT_OTHER)
Admission: EM | Admit: 2016-07-02 | Discharge: 2016-07-02 | Disposition: A | Discharge status: Home / Self Care | Payer: Managed Care, Other (non HMO) | Attending: Emergency Medicine | Admitting: Emergency Medicine

## 2016-07-02 ENCOUNTER — Emergency Department (HOSPITAL_BASED_OUTPATIENT_CLINIC_OR_DEPARTMENT_OTHER): Payer: Managed Care, Other (non HMO)

## 2016-07-02 DIAGNOSIS — M79642 Pain in left hand: Secondary | ICD-10-CM | POA: Diagnosis present

## 2016-07-02 DIAGNOSIS — F17200 Nicotine dependence, unspecified, uncomplicated: Secondary | ICD-10-CM | POA: Insufficient documentation

## 2016-07-02 MED ORDER — NAPROXEN 500 MG PO TABS: 500.0000 mg | ORAL_TABLET | Freq: Two times a day (BID) | ORAL | 0 refills | Status: DC

## 2016-07-02 MED ORDER — TRAMADOL HCL 50 MG PO TABS: 50.0000 mg | ORAL_TABLET | Freq: Four times a day (QID) | ORAL | 0 refills | Status: DC | PRN

## 2016-07-02 NOTE — ED Notes (Signed)
Pt verbalizes understanding of d/c instructions and denies any further need at this time. 

## 2016-07-02 NOTE — ED Notes (Signed)
MD at bedside. 

## 2016-07-02 NOTE — Discharge Instructions (Signed)
Apply ice several times a day. Wear the splint as needed.

## 2016-07-02 NOTE — ED Triage Notes (Addendum)
Pt c/o left hand throbbing and pain without injury.  Hand is slightly swollen, pt c/o pain mostly to outer part of hand just over fifth metacarpal.  Pt came from [place], but this is not worker's comp

## 2016-07-02 NOTE — ED Provider Notes (Signed)
[place] [facility] Provider Note   CSN: [id number] Arrival date & time: 07/02/16  0216     History   Chief Complaint Chief Complaint  Patient presents with  . Hand Pain    HPI Christian Peterson is a 45 y.o. male.  The history is provided by the patient.  Hand Pain   He had been working in the yard doing raking and other similar tasks. When he showered, he noticed that he was having some pain in his left hand and it hurt to move in certain directions. Pain is moderately severe and he rates it at 8/10. He denies specific injury that he could remember. He noticed that he was having difficulty buttoning his shirt afterwards. He took ibuprofen for pain but did not get significant relief. He tried to work but was unable to function and came to the ED.  Past Medical History:  Diagnosis Date  . Cellulitis    R foot    There are no active problems to display for this patient.   Past Surgical History:  Procedure Laterality Date  . JOINT REPLACEMENT     L ankle       Home Medications    Prior to Admission medications   Medication Sig Start Date End Date Taking? Authorizing Provider  cephALEXin (KEFLEX) 500 MG capsule Take 1 capsule (500 mg total) by mouth 4 (four) times daily. 06/25/13   Ernestina Patches, MD  chlorpheniramine-HYDROcodone (TUSSIONEX PENNKINETIC ER) 10-8 MG/5ML LQCR Take 5 mLs by mouth every 12 (twelve) hours as needed. 11/04/11   Glendell Docker, NP  oxyCODONE-acetaminophen (PERCOCET) 5-325 MG per tablet Take 1 tablet by mouth every 6 (six) hours as needed for pain. 06/25/13   Ernestina Patches, MD  polyethylene glycol [facility]) packet Take 17 g by mouth 2 (two) times daily. 10/20/13   Serita Grit, MD    Family History No family history on file.  Social History Social History  Substance Use Topics  . Smoking status: Current Every Day Smoker    Packs/day: 1.00  . Smokeless tobacco: Not on file  . Alcohol use Yes     Comment: Drinks 5 out of 7 days.   Mostly beer.  Some liquor.     Allergies   Review of patient's allergies indicates no known allergies.   Review of Systems Review of Systems  All other systems reviewed and are negative.    Physical Exam Updated Vital Signs BP 154/99 (BP Location: Right Arm)   Pulse 89   Temp 98.2 F (36.8 C) (Oral)   Resp 16   Ht 5\' 8"  (1.727 m)   Wt 150 lb (68 kg)   SpO2 100%   BMI 22.81 kg/m   Physical Exam  Nursing note and vitals reviewed.  45 year old male, resting comfortably and in no acute distress. Vital signs are significant for hypertension. Oxygen saturation is 100%, which is normal. Head is normocephalic and atraumatic. PERRLA, EOMI. Oropharynx is clear. Neck is nontender and supple without adenopathy or JVD. Back is nontender and there is no CVA tenderness. Lungs are clear without rales, wheezes, or rhonchi. Chest is nontender. Heart has regular rate and rhythm without murmur. Abdomen is soft, flat, nontender without masses or hepatosplenomegaly and peristalsis is normoactive. Extremities: There is mild swelling of the left hand but fingers do not appear swollen. There is minimal tenderness to palpation over the ulnar aspect of the left hand. No pain is elicited with passive range of motion. There was pain  elicited with extension of the fingers against resistance. Capillary refill is prompt. Skin is warm and dry without rash. Neurologic: Mental status is normal, cranial nerves are intact, there are no motor or sensory deficits.  ED Treatments / Results  Radiology Dg Hand Complete Left  Result Date: 07/02/2016 CLINICAL DATA:  Pain at the left distal fifth metacarpal, acute onset. Initial encounter. EXAM: LEFT HAND - COMPLETE 3+ VIEW COMPARISON:  None. FINDINGS: There is no evidence of fracture or dislocation. There is mild chronic deformity of the fifth metacarpal. The joint spaces are preserved. The carpal rows are intact, and demonstrate normal alignment. The soft tissues  are unremarkable in appearance. IMPRESSION: No evidence of fracture or dislocation. Electronically Signed   By: Garald Balding M.D.   On: 07/02/2016 03:18    Procedures Procedures (including critical care time)  Medications Ordered in ED Medications - No data to display   Initial Impression / Assessment and Plan / ED Course  I have reviewed the triage vital signs and the nursing notes.  Pertinent imaging results that were available during my care of the patient were reviewed by me and considered in my medical decision making (see chart for details).  Clinical Course    Left hand pain of uncertain cause. Will check x-ray.  X-ray shows no evidence of acute injury. This probably represents an overuse injury, possible mild extensor tendinitis. He is placed in a cock up wrist splint which she is to use as needed for comfort. Prescriptions given for naproxen and tramadol. Referred to hand surgery for follow-up.  Final Clinical Impressions(s) / ED Diagnoses   Final diagnoses:  Pain in left hand    New Prescriptions New Prescriptions   NAPROXEN (NAPROSYN) 500 MG TABLET    Take 1 tablet (500 mg total) by mouth 2 (two) times daily.   TRAMADOL (ULTRAM) 50 MG TABLET    Take 1 tablet (50 mg total) by mouth every 6 (six) hours as needed.     Delora Fuel, MD [id number]

## 2017-07-13 ENCOUNTER — Ambulatory Visit (INDEPENDENT_AMBULATORY_CARE_PROVIDER_SITE_OTHER): Payer: Managed Care, Other (non HMO) | Admitting: Orthopaedic Surgery

## 2017-07-13 ENCOUNTER — Encounter (INDEPENDENT_AMBULATORY_CARE_PROVIDER_SITE_OTHER): Payer: Self-pay | Admitting: Orthopaedic Surgery

## 2017-07-13 ENCOUNTER — Ambulatory Visit (INDEPENDENT_AMBULATORY_CARE_PROVIDER_SITE_OTHER): Payer: Managed Care, Other (non HMO)

## 2017-07-13 DIAGNOSIS — M25512 Pain in left shoulder: Secondary | ICD-10-CM

## 2017-07-13 DIAGNOSIS — G8929 Other chronic pain: Secondary | ICD-10-CM | POA: Diagnosis not present

## 2017-07-13 MED ORDER — BUPIVACAINE HCL 0.5 % IJ SOLN
3.0000 mL | INTRAMUSCULAR | Status: AC | PRN
  Administered 2017-07-13: 3 mL via INTRA_ARTICULAR

## 2017-07-13 MED ORDER — LIDOCAINE HCL 1 % IJ SOLN
3.0000 mL | INTRAMUSCULAR | Status: AC | PRN
  Administered 2017-07-13: 3 mL

## 2017-07-13 MED ORDER — METHYLPREDNISOLONE ACETATE 40 MG/ML IJ SUSP
40.0000 mg | INTRAMUSCULAR | Status: AC | PRN
  Administered 2017-07-13: 40 mg via INTRA_ARTICULAR

## 2017-07-13 NOTE — Progress Notes (Signed)
Office Visit Note   Patient: Christian Peterson           Date of Birth: 14-Aug-1971           MRN: [id number] Visit Date: 07/13/2017              Requested by: No referring provider defined for this encounter. PCP: Patient, No Pcp Per   Assessment & Plan: Visit Diagnoses:  1. Chronic left shoulder pain     Plan: Overall impression is left shoulder tendinitis and overuse. Recommend out of work for 2 weeks to rest. Subacromial injection was performed today. Scheduled ibuprofen for 2 weeks then as needed. If not better may consider physical therapy versus MRI.  Follow-Up Instructions: Return if symptoms worsen or fail to improve.   Orders:  Orders Placed This Encounter  Procedures  . XR Shoulder Left   No orders of the defined types were placed in this encounter.     Procedures: Large Joint Inj Date/Time: 07/13/2017 10:57 AM Performed by: Leandrew Koyanagi Authorized by: Leandrew Koyanagi   Consent Given by:  Patient Timeout: prior to procedure the correct patient, procedure, and site was verified   Location:  Shoulder Site:  L subacromial bursa Prep: patient was prepped and draped in usual sterile fashion   Needle Size:  22 G Approach:  Posterior Ultrasound Guidance: No   Fluoroscopic Guidance: No   Arthrogram: No   Medications:  3 mL lidocaine 1 %; 3 mL bupivacaine 0.5 %; 40 mg methylPREDNISolone acetate 40 MG/ML     Clinical Data: No additional findings.   Subjective: Chief Complaint  Patient presents with  . Left Shoulder - Pain, New Patient (Initial Visit)    46 year old gentleman who comes in with left shoulder pain for quite some time. He endorses mainly left shoulder pain with burning that is worse with activity and use. He takes Advil as needed. He is icing it. He endorses throbbing pain. 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 He denies any radicular symptoms. Denies any numbness and tingling.    Review of Systems  Constitutional: Negative.   All other  systems reviewed and are negative.    Objective: Vital Signs: There were no vitals taken for this visit.  Physical Exam  Constitutional: He is oriented to person, place, and time. He appears well-developed and well-nourished.  HENT:  Head: Normocephalic and atraumatic.  Eyes: Pupils are equal, round, and reactive to light.  Neck: Neck supple.  Pulmonary/Chest: Effort normal.  Abdominal: Soft.  Musculoskeletal: Normal range of motion.  Neurological: He is alert and oriented to person, place, and time.  Skin: Skin is warm.  Psychiatric: He has a normal mood and affect. His behavior is normal. Judgment and thought content normal.  Nursing note and vitals reviewed.   Ortho Exam Left shoulder exam shows normal range of motion. Rotator cuff testing is normal with just some mild weakness secondary to pain. Negative Spurling's. Mildly positive cross adduction and impingement signs. Specialty Comments:  No specialty comments available.  Imaging: Xr Shoulder Left  Result Date: 07/13/2017 No acute or structural abnormalities    PMFS History: There are no active problems to display for this patient.  Past Medical History:  Diagnosis Date  . Cellulitis    R foot    No family history on file.  Past Surgical History:  Procedure Laterality Date  . JOINT REPLACEMENT     L ankle   Social History   Occupational  History  . Not on file.   Social History Main Topics  . Smoking status: Current Every Day Smoker    Packs/day: 1.00  . Smokeless tobacco: Never Used  . Alcohol use Yes     Comment: Drinks 5 out of 7 days.  Mostly beer.  Some liquor.  . Drug use: Yes    Types: Marijuana  . Sexual activity: Not on file

## 2017-07-27 ENCOUNTER — Telehealth (INDEPENDENT_AMBULATORY_CARE_PROVIDER_SITE_OTHER): Payer: Self-pay | Admitting: Orthopaedic Surgery

## 2017-07-27 NOTE — Telephone Encounter (Signed)
Patient called asked if he can pick up his return to work note today/ The number to contact patient  Is [phone]

## 2017-07-27 NOTE — Telephone Encounter (Signed)
Patient called needing a return to work note. CB # [phone]

## 2017-07-27 NOTE — Telephone Encounter (Signed)
Letter ready for pick up at the front desk. Called pt pt aware.

## 2017-07-27 NOTE — Telephone Encounter (Signed)
Please advise 

## 2017-07-27 NOTE — Telephone Encounter (Signed)
yes

## 2020-06-10 ENCOUNTER — Encounter: Payer: Self-pay | Admitting: Medical

## 2020-06-10 ENCOUNTER — Other Ambulatory Visit: Payer: Self-pay

## 2020-06-10 ENCOUNTER — Ambulatory Visit (INDEPENDENT_AMBULATORY_CARE_PROVIDER_SITE_OTHER): Payer: Managed Care, Other (non HMO) | Admitting: Medical

## 2020-06-10 VITALS — BP 132/82 | HR 114 | Resp 18 | Ht 68.0 in | Wt 150.2 lb

## 2020-06-10 DIAGNOSIS — Z125 Encounter for screening for malignant neoplasm of prostate: Secondary | ICD-10-CM

## 2020-06-10 DIAGNOSIS — Z23 Encounter for immunization: Secondary | ICD-10-CM | POA: Diagnosis not present

## 2020-06-10 DIAGNOSIS — Z1211 Encounter for screening for malignant neoplasm of colon: Secondary | ICD-10-CM | POA: Diagnosis not present

## 2020-06-10 DIAGNOSIS — Z1283 Encounter for screening for malignant neoplasm of skin: Secondary | ICD-10-CM

## 2020-06-10 DIAGNOSIS — T7840XA Allergy, unspecified, initial encounter: Secondary | ICD-10-CM

## 2020-06-10 DIAGNOSIS — Z0000 Encounter for general adult medical examination without abnormal findings: Secondary | ICD-10-CM

## 2020-06-10 DIAGNOSIS — Z113 Encounter for screening for infections with a predominantly sexual mode of transmission: Secondary | ICD-10-CM

## 2020-06-10 MED ORDER — HYDROXYZINE HCL 10 MG PO TABS
ORAL_TABLET | ORAL | 0 refills | Status: DC
Start: 2020-06-10 — End: 2022-03-05

## 2020-06-10 MED ORDER — PREDNISONE 10 MG PO TABS
ORAL_TABLET | ORAL | 0 refills | Status: DC
Start: 2020-06-10 — End: 2020-08-13

## 2020-06-10 NOTE — Patient Instructions (Addendum)
For you wellness exam today I have ordered  Future cbc, cmp psa, lipid panel,and hiv.  Pt considering getting covid vaccine. Up to date tdap today.  Recommend exercise and healthy diet.  We will let you know lab results as they come in.  Follow up date appointment will be determined after lab review.   For allergic reaction vs chigger bite rx prednisone taper, hydroxyzine and advise calamaine.  Skin cancer screening with derm. Referral placed.  Referal to gi for colon cancer screening.   Preventive Care 39-14 Years Old, Male Preventive care refers to lifestyle choices and visits with your health care provider that can promote health and wellness. This includes:  A yearly physical exam. This is also called an annual well check.  Regular dental and eye exams.  Immunizations.  Screening for certain conditions.  Healthy lifestyle choices, such as eating a healthy diet, getting regular exercise, not using drugs or products that contain nicotine and tobacco, and limiting alcohol use. What can I expect for my preventive care visit? Physical exam Your health care provider will check:  Height and weight. These may be used to calculate body mass index (BMI), which is a measurement that tells if you are at a healthy weight.  Heart rate and blood pressure.  Your skin for abnormal spots. Counseling Your health care provider may ask you questions about:  Alcohol, tobacco, and drug use.  Emotional well-being.  Home and relationship well-being.  Sexual activity.  Eating habits.  Work and work Statistician. What immunizations do I need?  Influenza (flu) vaccine  This is recommended every year. Tetanus, diphtheria, and pertussis (Tdap) vaccine  You may need a Td booster every 10 years. Varicella (chickenpox) vaccine  You may need this vaccine if you have not already been vaccinated. Zoster (shingles) vaccine  You may need this after age 80. Measles, mumps, and rubella  (MMR) vaccine  You may need at least one dose of MMR if you were born in 1957 or later. You may also need a second dose. Pneumococcal conjugate (PCV13) vaccine  You may need this if you have certain conditions and were not previously vaccinated. Pneumococcal polysaccharide (PPSV23) vaccine  You may need one or two doses if you smoke cigarettes or if you have certain conditions. Meningococcal conjugate (MenACWY) vaccine  You may need this if you have certain conditions. Hepatitis A vaccine  You may need this if you have certain conditions or if you travel or work in places where you may be exposed to hepatitis A. Hepatitis B vaccine  You may need this if you have certain conditions or if you travel or work in places where you may be exposed to hepatitis B. Haemophilus influenzae type b (Hib) vaccine  You may need this if you have certain risk factors. Human papillomavirus (HPV) vaccine  If recommended by your health care provider, you may need three doses over 6 months. You may receive vaccines as individual doses or as more than one vaccine together in one shot (combination vaccines). Talk with your health care provider about the risks and benefits of combination vaccines. What tests do I need? Blood tests  Lipid and cholesterol levels. These may be checked every 5 years, or more frequently if you are over 74 years old.  Hepatitis C test.  Hepatitis B test. Screening  Lung cancer screening. You may have this screening every year starting at age 32 if you have a 30-pack-year history of smoking and currently smoke or have quit  within the past 15 years.  Prostate cancer screening. Recommendations will vary depending on your family history and other risks.  Colorectal cancer screening. All adults should have this screening starting at age 93 and continuing until age 15. Your health care provider may recommend screening at age 33 if you are at increased risk. You will have tests  every 1-10 years, depending on your results and the type of screening test.  Diabetes screening. This is done by checking your blood sugar (glucose) after you have not eaten for a while (fasting). You may have this done every 1-3 years.  Sexually transmitted disease (STD) testing. Follow these instructions at home: Eating and drinking  Eat a diet that includes fresh fruits and vegetables, whole grains, lean protein, and low-fat dairy products.  Take vitamin and mineral supplements as recommended by your health care provider.  Do not drink alcohol if your health care provider tells you not to drink.  If you drink alcohol: ? Limit how much you have to 0-2 drinks a day. ? Be aware of how much alcohol is in your drink. In the [place] one drink equals one 12 oz bottle of beer (355 mL), one 5 oz glass of wine (148 mL), or one 1 oz glass of hard liquor (44 mL). Lifestyle  Take daily care of your teeth and gums.  Stay active. Exercise for at least 30 minutes on 5 or more days each week.  Do not use any products that contain nicotine or tobacco, such as cigarettes, e-cigarettes, and chewing tobacco. If you need help quitting, ask your health care provider.  If you are sexually active, practice safe sex. Use a condom or other form of protection to prevent STIs (sexually transmitted infections).  Talk with your health care provider about taking a low-dose aspirin every day starting at age 58. What's next?  Go to your health care provider once a year for a well check visit.  Ask your health care provider how often you should have your eyes and teeth checked.  Stay up to date on all vaccines. This information is not intended to replace advice given to you by your health care provider. Make sure you discuss any questions you have with your health care provider. Document Revised: 10/27/2018 Document Reviewed: 10/27/2018 Elsevier Patient Education  2020 [employer].
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
## 2020-06-10 NOTE — Addendum Note (Signed)
Addended by: Jeronimo Greaves on: 06/10/2020 03:34 PM   Modules accepted: Orders

## 2020-06-12 ENCOUNTER — Encounter: Payer: Self-pay | Admitting: Gastroenterology

## 2020-06-12 ENCOUNTER — Telehealth: Payer: Self-pay | Admitting: Dermatology

## 2020-06-12 NOTE — Telephone Encounter (Signed)
Referral. Will not wait until January for appointment

## 2020-06-19 ENCOUNTER — Other Ambulatory Visit: Payer: Self-pay

## 2020-06-19 ENCOUNTER — Other Ambulatory Visit (INDEPENDENT_AMBULATORY_CARE_PROVIDER_SITE_OTHER): Payer: Managed Care, Other (non HMO)

## 2020-06-19 DIAGNOSIS — Z0000 Encounter for general adult medical examination without abnormal findings: Secondary | ICD-10-CM | POA: Diagnosis not present

## 2020-06-19 DIAGNOSIS — Z125 Encounter for screening for malignant neoplasm of prostate: Secondary | ICD-10-CM | POA: Diagnosis not present

## 2020-06-19 DIAGNOSIS — Z113 Encounter for screening for infections with a predominantly sexual mode of transmission: Secondary | ICD-10-CM

## 2020-06-19 LAB — COMPREHENSIVE METABOLIC PANEL
ALT: 29 U/L (ref 0–53)
AST: 28 U/L (ref 0–37)
Albumin: 4.6 g/dL (ref 3.5–5.2)
Alkaline Phosphatase: 84 U/L (ref 39–117)
BUN: 12 mg/dL (ref 6–23)
CO2: 31 mEq/L (ref 19–32)
Calcium: 9.7 mg/dL (ref 8.4–10.5)
Chloride: 101 mEq/L (ref 96–112)
Creatinine, Ser: 0.73 mg/dL (ref 0.40–1.50)
GFR: 113.3 mL/min (ref >60.00)
Glucose, Bld: 97 mg/dL (ref 70–99)
Potassium: 5.2 mEq/L — ABNORMAL HIGH (ref 3.5–5.1)
Sodium: 139 mEq/L (ref 135–145)
Total Bilirubin: 0.6 mg/dL (ref 0.2–1.2)
Total Protein: 7.5 g/dL (ref 6.0–8.3)

## 2020-06-19 LAB — CBC WITH DIFFERENTIAL/PLATELET
Basophils Absolute: 0.1 10*3/uL (ref 0.0–0.1)
Basophils Relative: 1.8 % (ref 0.0–3.0)
Eosinophils Absolute: 0.2 10*3/uL (ref 0.0–0.7)
Eosinophils Relative: 3.4 % (ref 0.0–5.0)
HCT: 52.2 % — ABNORMAL HIGH (ref 39.0–52.0)
Hemoglobin: 17.8 g/dL — ABNORMAL HIGH (ref 13.0–17.0)
Lymphocytes Relative: 15.6 % (ref 12.0–46.0)
Lymphs Abs: 1 10*3/uL (ref 0.7–4.0)
MCHC: 34 g/dL (ref 30.0–36.0)
MCV: 96.1 fl (ref 78.0–100.0)
Monocytes Absolute: 0.6 10*3/uL (ref 0.1–1.0)
Monocytes Relative: 10 % (ref 3.0–12.0)
Neutro Abs: 4.2 10*3/uL (ref 1.4–7.7)
Neutrophils Relative %: 69.2 % (ref 43.0–77.0)
Platelets: 202 10*3/uL (ref 150.0–400.0)
RBC: 5.44 Mil/uL (ref 4.22–5.81)
RDW: 12.8 % (ref 11.5–15.5)
WBC: 6.1 10*3/uL (ref 4.0–10.5)

## 2020-06-19 LAB — PSA: PSA: 0.58 ng/mL (ref 0.10–4.00)

## 2020-06-19 LAB — LIPID PANEL
Cholesterol: 206 mg/dL — ABNORMAL HIGH (ref 0–200)
HDL: 59.1 mg/dL (ref >39.00)
LDL Cholesterol: 120 mg/dL — ABNORMAL HIGH (ref 0–99)
NonHDL: 147.33
Total CHOL/HDL Ratio: 3
Triglycerides: 139 mg/dL (ref 0.0–149.0)
VLDL: 27.8 mg/dL (ref 0.0–40.0)

## 2020-06-20 ENCOUNTER — Telehealth: Payer: Self-pay | Admitting: Medical

## 2020-06-20 DIAGNOSIS — R7989 Other specified abnormal findings of blood chemistry: Secondary | ICD-10-CM

## 2020-06-20 DIAGNOSIS — E875 Hyperkalemia: Secondary | ICD-10-CM

## 2020-06-20 LAB — HIV ANTIBODY (ROUTINE TESTING W REFLEX): HIV 1&2 Ab, 4th Generation: NONREACTIVE

## 2020-06-20 NOTE — Telephone Encounter (Signed)
Future cmp and cbc placed.

## 2020-06-28 ENCOUNTER — Other Ambulatory Visit: Payer: Self-pay

## 2020-06-28 ENCOUNTER — Other Ambulatory Visit (INDEPENDENT_AMBULATORY_CARE_PROVIDER_SITE_OTHER): Payer: Managed Care, Other (non HMO)

## 2020-06-28 DIAGNOSIS — R7989 Other specified abnormal findings of blood chemistry: Secondary | ICD-10-CM

## 2020-06-28 DIAGNOSIS — E875 Hyperkalemia: Secondary | ICD-10-CM

## 2020-06-28 LAB — CBC WITH DIFFERENTIAL/PLATELET
Basophils Absolute: 0.1 10*3/uL (ref 0.0–0.1)
Basophils Relative: 2.5 % (ref 0.0–3.0)
Eosinophils Absolute: 0.2 10*3/uL (ref 0.0–0.7)
Eosinophils Relative: 4.6 % (ref 0.0–5.0)
HCT: 50.6 % (ref 39.0–52.0)
Hemoglobin: 17.1 g/dL — ABNORMAL HIGH (ref 13.0–17.0)
Lymphocytes Relative: 22.2 % (ref 12.0–46.0)
Lymphs Abs: 1.1 10*3/uL (ref 0.7–4.0)
MCHC: 33.9 g/dL (ref 30.0–36.0)
MCV: 96.4 fl (ref 78.0–100.0)
Monocytes Absolute: 0.5 10*3/uL (ref 0.1–1.0)
Monocytes Relative: 10.1 % (ref 3.0–12.0)
Neutro Abs: 3 10*3/uL (ref 1.4–7.7)
Neutrophils Relative %: 60.6 % (ref 43.0–77.0)
Platelets: 194 10*3/uL (ref 150.0–400.0)
RBC: 5.24 Mil/uL (ref 4.22–5.81)
RDW: 12.8 % (ref 11.5–15.5)
WBC: 5 10*3/uL (ref 4.0–10.5)

## 2020-06-28 LAB — COMPREHENSIVE METABOLIC PANEL
ALT: 31 U/L (ref 0–53)
AST: 29 U/L (ref 0–37)
Albumin: 4.4 g/dL (ref 3.5–5.2)
Alkaline Phosphatase: 80 U/L (ref 39–117)
BUN: 11 mg/dL (ref 6–23)
CO2: 32 mEq/L (ref 19–32)
Calcium: 9.5 mg/dL (ref 8.4–10.5)
Chloride: 103 mEq/L (ref 96–112)
Creatinine, Ser: 0.72 mg/dL (ref 0.40–1.50)
GFR: 115.11 mL/min (ref >60.00)
Glucose, Bld: 96 mg/dL (ref 70–99)
Potassium: 4.8 mEq/L (ref 3.5–5.1)
Sodium: 141 mEq/L (ref 135–145)
Total Bilirubin: 0.4 mg/dL (ref 0.2–1.2)
Total Protein: 7.3 g/dL (ref 6.0–8.3)

## 2020-08-01 ENCOUNTER — Other Ambulatory Visit: Payer: Self-pay

## 2020-08-01 ENCOUNTER — Ambulatory Visit (AMBULATORY_SURGERY_CENTER): Payer: Self-pay | Admitting: *Deleted

## 2020-08-01 VITALS — Ht 68.0 in | Wt 150.0 lb

## 2020-08-01 DIAGNOSIS — Z01818 Encounter for other preprocedural examination: Secondary | ICD-10-CM

## 2020-08-01 DIAGNOSIS — Z1211 Encounter for screening for malignant neoplasm of colon: Secondary | ICD-10-CM

## 2020-08-01 MED ORDER — PLENVU 140 G PO SOLR: 1.0000 | ORAL | 0 refills | Status: DC

## 2020-08-01 NOTE — Progress Notes (Signed)
No egg or soy allergy known to patient  No issues with past sedation with any surgeries or procedures no intubation problems in the past  No FH of Malignant Hyperthermia No diet pills per patient No home 02 use per patient  No blood thinners per patient  Pt denies issues with constipation  No A fib or A flutter  EMMI video to pt or via MyChart  COVID 19 guidelines implemented in [facility] today with Pt and RN   Plenvu Coupon given to pt in [facility] today , Code to Pharmacy   Due to the COVID-19 pandemic we are asking patients to follow these guidelines. Please only bring one care partner. Please be aware that your care partner may wait in the car in the parking lot or if they feel like they will be too hot to wait in the car, they may wait in the lobby on the 4th floor. All care partners are required to wear a mask the entire time (we do not have any that we can provide them), they need to practice social distancing, and we will do a Covid check for all patient's and care partners when you arrive. Also we will check their temperature and your temperature. If the care partner waits in their car they need to stay in the parking lot the entire time and we will call them on their cell phone when the patient is ready for discharge so they can bring the car to the front of the building. Also all patient's will need to wear a mask into building.  

## 2020-08-02 ENCOUNTER — Encounter: Payer: Self-pay | Admitting: Gastroenterology

## 2020-08-09 ENCOUNTER — Ambulatory Visit (INDEPENDENT_AMBULATORY_CARE_PROVIDER_SITE_OTHER): Payer: 59

## 2020-08-09 ENCOUNTER — Other Ambulatory Visit: Payer: Self-pay | Admitting: Gastroenterology

## 2020-08-09 DIAGNOSIS — Z1159 Encounter for screening for other viral diseases: Secondary | ICD-10-CM

## 2020-08-09 LAB — SARS CORONAVIRUS 2 (TAT 6-24 HRS): SARS Coronavirus 2: NEGATIVE

## 2020-08-13 ENCOUNTER — Encounter: Payer: Self-pay | Admitting: Gastroenterology

## 2020-08-13 ENCOUNTER — Ambulatory Visit (AMBULATORY_SURGERY_CENTER): Payer: 59 | Admitting: Gastroenterology

## 2020-08-13 ENCOUNTER — Other Ambulatory Visit: Payer: Self-pay

## 2020-08-13 VITALS — BP 145/93 | HR 86 | Temp 99.6°F | Resp 14 | Ht 68.0 in | Wt 150.0 lb

## 2020-08-13 DIAGNOSIS — Z1211 Encounter for screening for malignant neoplasm of colon: Secondary | ICD-10-CM

## 2020-08-13 DIAGNOSIS — D122 Benign neoplasm of ascending colon: Secondary | ICD-10-CM | POA: Diagnosis not present

## 2020-08-13 DIAGNOSIS — D123 Benign neoplasm of transverse colon: Secondary | ICD-10-CM

## 2020-08-13 DIAGNOSIS — K635 Polyp of colon: Secondary | ICD-10-CM

## 2020-08-13 DIAGNOSIS — D125 Benign neoplasm of sigmoid colon: Secondary | ICD-10-CM

## 2020-08-13 MED ORDER — SODIUM CHLORIDE 0.9 % IV SOLN: 500.0000 mL | Freq: Once | INTRAVENOUS | Status: DC

## 2020-08-13 NOTE — Op Note (Signed)
[place] Patient Name: Christian Peterson Procedure Date: 08/13/2020 2:40 PM MRN: [id number] Endoscopist: Mallie Mussel L. Loletha Peterson , MD Age: 49 Referring MD:  Date of Birth: 1971/08/20 Gender: Male Account #: [id number] Procedure:                Colonoscopy Indications:              Screening for colorectal malignant neoplasm, This                            is the patient's first colonoscopy Medicines:                Monitored Anesthesia Care Procedure:                Pre-Anesthesia Assessment:                           - Prior to the procedure, a History and Physical                            was performed, and patient medications and                            allergies were reviewed. The patient's tolerance of                            previous anesthesia was also reviewed. The risks                            and benefits of the procedure and the sedation                            options and risks were discussed with the patient.                            All questions were answered, and informed consent                            was obtained. Prior Anticoagulants: The patient has                            taken no previous anticoagulant or antiplatelet                            agents. ASA Grade Assessment: II - A patient with                            mild systemic disease. After reviewing the risks                            and benefits, the patient was deemed in                            satisfactory condition to undergo the procedure.  After obtaining informed consent, the colonoscope                            was passed under direct vision. Throughout the                            procedure, the patient's blood pressure, pulse, and                            oxygen saturations were monitored continuously. The                            Colonoscope was introduced through the anus and                            advanced to the the cecum,  identified by                            appendiceal orifice and ileocecal valve. The                            colonoscopy was somewhat difficult due to a                            tortuous colon. The patient tolerated the procedure                            well. The quality of the bowel preparation was                            good. The ileocecal valve, appendiceal orifice, and                            rectum were photographed. The bowel preparation                            used was Plenvu. Scope In: 2:56:39 PM Scope Out: 3:23:02 PM Scope Withdrawal Time: 0 hours 20 minutes 57 seconds  Total Procedure Duration: 0 hours 26 minutes 23 seconds  Findings:                 The perianal and digital rectal examinations were                            normal.                           Three sessile polyps were found in the distal                            transverse colon and ascending colon. The polyps                            were 4 to 10 mm in size. These polyps were removed  with a cold snare. Resection and retrieval were                            complete.                           A 8 mm polyp was found in the mid sigmoid colon.                            The polyp was pedunculated. The polyp was removed                            with a hot snare. Resection and retrieval were                            complete.                           Multiple diverticula were found in the left colon.                           Internal hemorrhoids were found. The hemorrhoids                            were large.                           The exam was otherwise without abnormality on                            direct and retroflexion views. Complications:            No immediate complications. Estimated Blood Loss:     Estimated blood loss was minimal. Impression:               - Three 4 to 10 mm polyps in the distal transverse                            colon  and in the ascending colon, removed with a                            cold snare. Resected and retrieved.                           - One 8 mm polyp in the mid sigmoid colon, removed                            with a hot snare. Resected and retrieved.                           - Diverticulosis in the left colon.                           - Internal hemorrhoids.                           -  The examination was otherwise normal on direct                            and retroflexion views. Recommendation:           - Patient has a contact number available for                            emergencies. The signs and symptoms of potential                            delayed complications were discussed with the                            patient. Return to normal activities tomorrow.                            Written discharge instructions were provided to the                            patient.                           - Resume previous diet.                           - Continue present medications.                           - Await pathology results.                           - Repeat colonoscopy is recommended for                            surveillance. The colonoscopy date will be                            determined after pathology results from today's                            exam become available for review. Christian Aguado L. Loletha Carrow, MD 08/13/2020 3:29:12 PM This report has been signed electronically.

## 2020-08-13 NOTE — Patient Instructions (Signed)
 Handouts on polyps,diverticulosis ,& hemorrhoids given to you today  Await pathology results on polyps removed    YOU HAD AN ENDOSCOPIC PROCEDURE TODAY AT THE [facility]:   Refer to the procedure report that was given to you for any specific questions about what was found during the examination.  If the procedure report does not answer your questions, please call your gastroenterologist to clarify.  If you requested that your care partner not be given the details of your procedure findings, then the procedure report has been included in a sealed envelope for you to review at your convenience later.  YOU SHOULD EXPECT: Some feelings of bloating in the abdomen. Passage of more gas than usual.  Walking can help get rid of the air that was put into your GI tract during the procedure and reduce the bloating. If you had a lower endoscopy (such as a colonoscopy or flexible sigmoidoscopy) you may notice spotting of blood in your stool or on the toilet paper. If you underwent a bowel prep for your procedure, you may not have a normal bowel movement for a few days.  Please Note:  You might notice some irritation and congestion in your nose or some drainage.  This is from the oxygen used during your procedure.  There is no need for concern and it should clear up in a day or so.  SYMPTOMS TO REPORT IMMEDIATELY:   Following lower endoscopy (colonoscopy or flexible sigmoidoscopy):  Excessive amounts of blood in the stool  Significant tenderness or worsening of abdominal pains  Swelling of the abdomen that is new, acute  Fever of 100F or higher    For urgent or emergent issues, a gastroenterologist can be reached at any hour by calling [phone]. Do not use MyChart messaging for urgent concerns.    DIET:  We do recommend a small meal at first, but then you may proceed to your regular diet.  Drink plenty of fluids but you should avoid alcoholic beverages for 24 hours.  ACTIVITY:   You should plan to take it easy for the rest of today and you should NOT DRIVE or use heavy machinery until tomorrow (because of the sedation medicines used during the test).    FOLLOW UP: Our staff will call the number listed on your records 48-72 hours following your procedure to check on you and address any questions or concerns that you may have regarding the information given to you following your procedure. If we do not reach you, we will leave a message.  We will attempt to reach you two times.  During this call, we will ask if you have developed any symptoms of COVID 19. If you develop any symptoms (ie: fever, flu-like symptoms, shortness of breath, cough etc.) before then, please call [phone].  If you test positive for Covid 19 in the 2 weeks post procedure, please call and report this information to us.    If any biopsies were taken you will be contacted by phone or by letter within the next 1-3 weeks.  Please call us at [phone] if you have not heard about the biopsies in 3 weeks.    SIGNATURES/CONFIDENTIALITY: You and/or your care partner have signed paperwork which will be entered into your electronic medical record.  These signatures attest to the fact that that the information above on your After Visit Summary has been reviewed and is understood.  Full responsibility of the confidentiality of this discharge information lies with you   and/or your care-partner. 

## 2020-08-13 NOTE — Progress Notes (Signed)
Called to room to assist during endoscopic procedure.  Patient ID and intended procedure confirmed with present staff. Received instructions for my participation in the procedure from the performing physician.  

## 2020-08-13 NOTE — Progress Notes (Signed)
Pt's states no medical or surgical changes since previsit or office visit.  V/S-SF  CHECK-IN-AR  Made CRNA Aware of elevated blood pressure,pt. Denies pain.

## 2020-08-13 NOTE — Progress Notes (Signed)
PT taken to PACU. Monitors in place. VSS. Report given to RN. 

## 2020-08-15 ENCOUNTER — Telehealth: Payer: Self-pay | Admitting: *Deleted

## 2020-08-15 NOTE — Telephone Encounter (Signed)
Message left

## 2020-08-15 NOTE — Telephone Encounter (Signed)
  Follow up Call-  Call back number 08/13/2020  Post procedure Call Back phone  # [phone]  Permission to leave phone message Yes  Some recent data might be hidden     Patient questions:  Do you have a fever, pain , or abdominal swelling? No. Pain Score  0 *  Have you tolerated food without any problems? Yes.    Have you been able to return to your normal activities? Yes.    Do you have any questions about your discharge instructions: Diet   No. Medications  No. Follow up visit  No.  Do you have questions or concerns about your Care? No.  Actions: * If pain score is 4 or above: No action needed, pain <4.  1. Have you developed a fever since your procedure? no  2.   Have you had an respiratory symptoms (SOB or cough) since your procedure? no  3.   Have you tested positive for COVID 19 since your procedure no  4.   Have you had any family members/close contacts diagnosed with the COVID 19 since your procedure?  no   If yes to any of these questions please route to Joylene John, RN and Joella Prince, RN

## 2020-08-19 ENCOUNTER — Encounter: Payer: Self-pay | Admitting: Gastroenterology

## 2022-03-05 ENCOUNTER — Other Ambulatory Visit: Payer: Self-pay

## 2022-03-05 ENCOUNTER — Telehealth: Payer: Self-pay | Admitting: Medical

## 2022-03-05 ENCOUNTER — Ambulatory Visit (INDEPENDENT_AMBULATORY_CARE_PROVIDER_SITE_OTHER): Payer: Managed Care, Other (non HMO) | Admitting: Medical

## 2022-03-05 ENCOUNTER — Emergency Department (HOSPITAL_BASED_OUTPATIENT_CLINIC_OR_DEPARTMENT_OTHER): Payer: Managed Care, Other (non HMO)

## 2022-03-05 ENCOUNTER — Encounter (HOSPITAL_BASED_OUTPATIENT_CLINIC_OR_DEPARTMENT_OTHER): Payer: Self-pay | Admitting: Emergency Medicine

## 2022-03-05 ENCOUNTER — Inpatient Hospital Stay (HOSPITAL_BASED_OUTPATIENT_CLINIC_OR_DEPARTMENT_OTHER)
Admission: EM | Admit: 2022-03-05 | Discharge: 2022-03-08 | DRG: 392 | Disposition: A | Discharge status: Home / Self Care | Payer: Managed Care, Other (non HMO) | Attending: Internal Medicine | Admitting: Internal Medicine

## 2022-03-05 ENCOUNTER — Encounter: Payer: Self-pay | Admitting: Medical

## 2022-03-05 VITALS — BP 175/93 | HR 100 | Temp 98.8°F | Resp 18 | Ht 68.0 in | Wt 158.0 lb

## 2022-03-05 DIAGNOSIS — E871 Hypo-osmolality and hyponatremia: Secondary | ICD-10-CM | POA: Diagnosis present

## 2022-03-05 DIAGNOSIS — K5720 Diverticulitis of large intestine with perforation and abscess without bleeding: Principal | ICD-10-CM

## 2022-03-05 DIAGNOSIS — R1032 Left lower quadrant pain: Secondary | ICD-10-CM | POA: Diagnosis not present

## 2022-03-05 DIAGNOSIS — K5792 Diverticulitis of intestine, part unspecified, without perforation or abscess without bleeding: Principal | ICD-10-CM

## 2022-03-05 DIAGNOSIS — Z720 Tobacco use: Secondary | ICD-10-CM

## 2022-03-05 DIAGNOSIS — F1090 Alcohol use, unspecified, uncomplicated: Secondary | ICD-10-CM

## 2022-03-05 DIAGNOSIS — R030 Elevated blood-pressure reading, without diagnosis of hypertension: Secondary | ICD-10-CM

## 2022-03-05 DIAGNOSIS — T7840XA Allergy, unspecified, initial encounter: Secondary | ICD-10-CM | POA: Insufficient documentation

## 2022-03-05 DIAGNOSIS — R110 Nausea: Secondary | ICD-10-CM | POA: Diagnosis not present

## 2022-03-05 DIAGNOSIS — R1033 Periumbilical pain: Secondary | ICD-10-CM | POA: Diagnosis not present

## 2022-03-05 DIAGNOSIS — R102 Pelvic and perineal pain: Secondary | ICD-10-CM

## 2022-03-05 DIAGNOSIS — K5730 Diverticulosis of large intestine without perforation or abscess without bleeding: Secondary | ICD-10-CM | POA: Insufficient documentation

## 2022-03-05 DIAGNOSIS — K529 Noninfective gastroenteritis and colitis, unspecified: Secondary | ICD-10-CM | POA: Diagnosis present

## 2022-03-05 DIAGNOSIS — E876 Hypokalemia: Secondary | ICD-10-CM | POA: Diagnosis present

## 2022-03-05 DIAGNOSIS — K5780 Diverticulitis of intestine, part unspecified, with perforation and abscess without bleeding: Secondary | ICD-10-CM | POA: Insufficient documentation

## 2022-03-05 DIAGNOSIS — I10 Essential (primary) hypertension: Secondary | ICD-10-CM | POA: Diagnosis present

## 2022-03-05 DIAGNOSIS — D72829 Elevated white blood cell count, unspecified: Secondary | ICD-10-CM | POA: Diagnosis present

## 2022-03-05 DIAGNOSIS — K219 Gastro-esophageal reflux disease without esophagitis: Secondary | ICD-10-CM | POA: Diagnosis present

## 2022-03-05 DIAGNOSIS — F17210 Nicotine dependence, cigarettes, uncomplicated: Secondary | ICD-10-CM | POA: Diagnosis present

## 2022-03-05 DIAGNOSIS — K648 Other hemorrhoids: Secondary | ICD-10-CM | POA: Insufficient documentation

## 2022-03-05 HISTORY — DX: Diverticulosis of large intestine without perforation or abscess without bleeding: K57.30

## 2022-03-05 HISTORY — DX: Essential (primary) hypertension: I10

## 2022-03-05 HISTORY — DX: Elevated blood-pressure reading, without diagnosis of hypertension: R03.0

## 2022-03-05 HISTORY — DX: Other hemorrhoids: K64.8

## 2022-03-05 HISTORY — DX: Diverticulitis of intestine, part unspecified, with perforation and abscess without bleeding: K57.80

## 2022-03-05 HISTORY — DX: Diverticulitis of large intestine with perforation and abscess without bleeding: K57.20

## 2022-03-05 HISTORY — DX: Tobacco use: Z72.0

## 2022-03-05 HISTORY — DX: Alcohol use, unspecified, uncomplicated: F10.90

## 2022-03-05 LAB — CBC
HCT: 48.5 % (ref 39.0–52.0)
Hemoglobin: 16.8 g/dL (ref 13.0–17.0)
MCH: 33.1 pg (ref 26.0–34.0)
MCHC: 34.6 g/dL (ref 30.0–36.0)
MCV: 95.7 fL (ref 80.0–100.0)
Platelets: 233 10*3/uL (ref 150–400)
RBC: 5.07 MIL/uL (ref 4.22–5.81)
RDW: 11.9 % (ref 11.5–15.5)
WBC: 12.3 10*3/uL — ABNORMAL HIGH (ref 4.0–10.5)
nRBC: 0 % (ref 0.0–0.2)

## 2022-03-05 LAB — URINALYSIS, ROUTINE W REFLEX MICROSCOPIC
Bilirubin Urine: NEGATIVE
Glucose, UA: NEGATIVE mg/dL
Hgb urine dipstick: NEGATIVE
Ketones, ur: NEGATIVE mg/dL
Leukocytes,Ua: NEGATIVE
Nitrite: NEGATIVE
Protein, ur: NEGATIVE mg/dL
Specific Gravity, Urine: 1.02 (ref 1.005–1.030)
pH: 5 (ref 5.0–8.0)

## 2022-03-05 LAB — COMPREHENSIVE METABOLIC PANEL
ALT: 53 U/L — ABNORMAL HIGH (ref 0–44)
AST: 51 U/L — ABNORMAL HIGH (ref 15–41)
Albumin: 4.2 g/dL (ref 3.5–5.0)
Alkaline Phosphatase: 74 U/L (ref 38–126)
Anion gap: 10 (ref 5–15)
BUN: 12 mg/dL (ref 6–20)
CO2: 27 mmol/L (ref 22–32)
Calcium: 9.6 mg/dL (ref 8.9–10.3)
Chloride: 102 mmol/L (ref 98–111)
Creatinine, Ser: 0.73 mg/dL (ref 0.61–1.24)
GFR, Estimated: >60 mL/min (ref >60)
Glucose, Bld: 110 mg/dL — ABNORMAL HIGH (ref 70–99)
Potassium: 4.1 mmol/L (ref 3.5–5.1)
Sodium: 139 mmol/L (ref 135–145)
Total Bilirubin: 0.8 mg/dL (ref 0.3–1.2)
Total Protein: 8.1 g/dL (ref 6.5–8.1)

## 2022-03-05 LAB — LIPASE, BLOOD: Lipase: 40 U/L (ref 11–51)

## 2022-03-05 MED ORDER — MORPHINE SULFATE (PF) 2 MG/ML IV SOLN
2.0000 mg | INTRAVENOUS | Status: DC | PRN
  Administered 2022-03-06: 2 mg via INTRAVENOUS
  Filled 2022-03-05: qty 1

## 2022-03-05 MED ORDER — SODIUM CHLORIDE 0.9 % IV SOLN: INTRAVENOUS | Status: DC

## 2022-03-05 MED ORDER — MORPHINE SULFATE (PF) 4 MG/ML IV SOLN
4.0000 mg | Freq: Once | INTRAVENOUS | Status: AC
  Administered 2022-03-05: 4 mg via INTRAVENOUS
  Filled 2022-03-05: qty 1

## 2022-03-05 MED ORDER — MORPHINE SULFATE (PF) 2 MG/ML IV SOLN
2.0000 mg | INTRAVENOUS | Status: DC | PRN
  Administered 2022-03-05: 2 mg via INTRAVENOUS
  Filled 2022-03-05: qty 1

## 2022-03-05 MED ORDER — LABETALOL HCL 5 MG/ML IV SOLN
10.0000 mg | INTRAVENOUS | Status: DC | PRN
  Administered 2022-03-06 – 2022-03-07 (×7): 10 mg via INTRAVENOUS
  Filled 2022-03-05 (×7): qty 4

## 2022-03-05 MED ORDER — PIPERACILLIN-TAZOBACTAM 3.375 G IVPB
3.3750 g | Freq: Three times a day (TID) | INTRAVENOUS | Status: DC
  Administered 2022-03-05 – 2022-03-08 (×8): 3.375 g via INTRAVENOUS
  Filled 2022-03-05 (×10): qty 50

## 2022-03-05 MED ORDER — HYDROMORPHONE HCL 1 MG/ML IJ SOLN
0.5000 mg | Freq: Once | INTRAMUSCULAR | Status: AC
  Administered 2022-03-05: 0.5 mg via INTRAVENOUS
  Filled 2022-03-05: qty 1

## 2022-03-05 MED ORDER — PIPERACILLIN-TAZOBACTAM 3.375 G IVPB 30 MIN
3.3750 g | Freq: Once | INTRAVENOUS | Status: AC
  Administered 2022-03-05: 3.375 g via INTRAVENOUS
  Filled 2022-03-05: qty 50

## 2022-03-05 MED ORDER — IOHEXOL 300 MG/ML  SOLN
100.0000 mL | Freq: Once | INTRAMUSCULAR | Status: AC | PRN
  Administered 2022-03-05: 100 mL via INTRAVENOUS

## 2022-03-05 MED ORDER — ONDANSETRON HCL 4 MG/2ML IJ SOLN
4.0000 mg | Freq: Once | INTRAMUSCULAR | Status: AC
  Administered 2022-03-05: 4 mg via INTRAVENOUS
  Filled 2022-03-05: qty 2

## 2022-03-05 MED ORDER — HYDROMORPHONE HCL 1 MG/ML IJ SOLN
0.5000 mg | INTRAMUSCULAR | Status: DC | PRN
  Administered 2022-03-05 – 2022-03-06 (×3): 0.5 mg via INTRAVENOUS
  Filled 2022-03-05 (×3): qty 0.5

## 2022-03-05 MED ORDER — SODIUM CHLORIDE 0.9 % IV BOLUS
1000.0000 mL | Freq: Once | INTRAVENOUS | Status: AC
  Administered 2022-03-05: 1000 mL via INTRAVENOUS

## 2022-03-05 MED ORDER — SODIUM CHLORIDE 0.9 % IV SOLN: INTRAVENOUS | Status: DC | PRN

## 2022-03-05 MED ORDER — ONDANSETRON HCL 4 MG PO TABS: 4.0000 mg | ORAL_TABLET | Freq: Three times a day (TID) | ORAL | 0 refills | Status: DC | PRN

## 2022-03-05 MED ORDER — FAMOTIDINE 20 MG PO TABS: 20.0000 mg | ORAL_TABLET | Freq: Two times a day (BID) | ORAL | 0 refills | Status: DC

## 2022-03-05 MED ORDER — PIPERACILLIN-TAZOBACTAM 3.375 G IVPB 30 MIN: 3.3750 g | Freq: Three times a day (TID) | INTRAVENOUS | Status: DC

## 2022-03-05 NOTE — Assessment & Plan Note (Signed)
No formal dx of HTN. Suspect he likely has baseline HTN from ETOH and tobacco use. Pain also contributory.  ?PRN IV labetalol for SBP >170  ?

## 2022-03-05 NOTE — Assessment & Plan Note (Signed)
Reports 3-4 beers nightly with last drink 4 nights ago. No hx of withdrawal. No signs of current withdrawal. Low threshold to start CIWA if needed. ?

## 2022-03-05 NOTE — Assessment & Plan Note (Addendum)
Mild tenderness on abdominal exam without peritonitis ?Keep NPO  ?Continue IV Zosyn ?Continuous IV fluid overnight  ?Surgery consulted and will evaluate in the morning ?PRN morphine for moderate pain and low dose Dilaudid IV for severe pain ?

## 2022-03-05 NOTE — Plan of Care (Signed)

## 2022-03-05 NOTE — H&P (Signed)
?History and Physical  ? ? ?Patient: Christian Peterson ZOX:[id number] DOB: April 28, 1971 ?DOA: 03/05/2022 ?DOS: the patient was seen and examined on 03/05/2022 ?PCP: Mackie Pai, PA-C  ?Patient coming from: Home ? ?Chief Complaint:  ?Chief Complaint  ?Patient presents with  ? Abdominal Pain  ? ?HPI: Christian Peterson is a 50 y.o. male with medical history significant of  GERD, diverticulosis who presents from outside ED for acute diverticulitis with microperforation. ? ?Symptoms started about 4 days ago with lower abdominal pain. This gradually worsened with mostly constipation and intermittent light diarrhea with minimal bright red blood on toilet paper. Has nausea but no vomiting. Decrease appeptite. No fever. Tried ibuprofen without relieve. He then went to PCP today and was asked to present to ED. No hx of abdominal surgery.  ? ?In the ED, he was afebrile with initial SBP of 205, tachycardic up to 105 on room air.  Leukocytosis of 12.3, hemoglobin of 16.8.  CMP with mildly elevated AST of 51, ALT of 53 with other electrolytes and creatinine otherwise normal. ? ?CT of the abdomen pelvis showed diverticulitis with microperforation and likely enteritis/ileitis of the small bowel.  General surgery Dr. Johney Maine was consulted by ED PA and agrees with IV antibiotics and medical admission.  Hospitalist then consulted for transfer here to [facility]. ? ?Review of Systems: As mentioned in the history of present illness. All other systems reviewed and are negative. ?Past Medical History:  ?Diagnosis Date  ? Allergy   ? Cellulitis   ? R foot  ? GERD (gastroesophageal reflux disease)   ? occ with spicy foods   ? ?Past Surgical History:  ?Procedure Laterality Date  ? ANKLE FRACTURE SURGERY    ? plate   ? WISDOM TOOTH EXTRACTION    ? ?Social History:  reports that he has been smoking cigarettes. He has been smoking an average of 1 pack per day. He has never used smokeless tobacco. He reports current alcohol use. He reports current  drug use. Drug: Marijuana. ? ?No Known Allergies ? ?Family History  ?Adopted: Yes  ? ? ?Prior to Admission medications   ?Medication Sig Start Date End Date Taking? Authorizing Provider  ?ibuprofen (ADVIL) 200 MG tablet Take 400 mg by mouth every 6 (six) hours as needed for mild pain or moderate pain.   Yes [provider]  ?famotidine (PEPCID) 20 MG tablet Take 1 tablet (20 mg total) by mouth 2 (two) times daily. ?Patient not taking: Reported on 03/05/2022 03/05/22   Saguier, Percell Miller, PA-C  ?ondansetron (ZOFRAN) 4 MG tablet Take 1 tablet (4 mg total) by mouth every 8 (eight) hours as needed for nausea or vomiting. ?Patient not taking: Reported on 03/05/2022 03/05/22   Saguier, Percell Miller, PA-C  ? ? ?Physical Exam: ?Vitals:  ? 03/05/22 1530 03/05/22 1600 03/05/22 1830 03/05/22 2100  ?BP: (!) 150/123 (!) 184/116 (!) 175/117 (!) 153/89  ?Pulse: 94 92 90 90  ?Resp: '19 14 18 17  '$ ?Temp:   98.6 ?F (37 ?C) 98.2 ?F (36.8 ?C)  ?TempSrc:    Oral  ?SpO2: 97% 95% 95% 96%  ?Weight:      ?Height:      ? ?Constitutional: NAD, calm, comfortable, well-appearing middle-age male laying upright in bed ?Eyes: lids and conjunctivae normal ?ENMT: Mucous membranes are moist.  ?Neck: normal, supple ?Respiratory: clear to auscultation bilaterally, no wheezing, no crackles. Normal respiratory effort.  ?Cardiovascular: Regular rate and rhythm, no murmurs / rubs / gallops. No extremity edema.  ?Abdomen: Soft,  moderately distended abdomen with mild left lower quadrant pain.   ?Musculoskeletal: no clubbing / cyanosis. No joint deformity upper and lower extremities. Good ROM, no contractures. Normal muscle tone.  ?Skin: no rashes, lesions, ulcers.  ?Neurologic: CN 2-12 grossly intact. Strength 5/5 in all 4.  ?Psychiatric: Normal judgment and insight. Alert and oriented x 3. Normal mood. ?Data Reviewed: ? ?See HPI ? ?Assessment and Plan: ?* Diverticulitis of colon with perforation ?Mild tenderness on abdominal exam without peritonitis ?Keep NPO   ?Continue IV Zosyn ?Continuous IV fluid overnight  ?Surgery consulted and will evaluate in the morning ?PRN morphine for moderate pain and low dose Dilaudid IV for severe pain ? ?Elevated BP without diagnosis of hypertension ?No formal dx of HTN. Suspect he likely has baseline HTN from ETOH and tobacco use. Pain also contributory.  ?PRN IV labetalol for SBP >170  ? ?Heavy alcohol consumption ?Reports 3-4 beers nightly with last drink 4 nights ago. No hx of withdrawal. No signs of current withdrawal. Low threshold to start CIWA if needed. ? ?Tobacco abuse ?1 pack daily. Declined nicotine patch. ? ? ? ? ? Advance Care Planning:   Code Status: Full Code  ? ?Consults: General surgery ? ?Family Communication: No family at bedside ? ?Severity of Illness: ?The appropriate patient status for this patient is INPATIENT. Inpatient status is judged to be reasonable and necessary in order to provide the required intensity of service to ensure the patient's safety. The patient's presenting symptoms, physical exam findings, and initial radiographic and laboratory data in the context of their chronic comorbidities is felt to place them at high risk for further clinical deterioration. Furthermore, it is not anticipated that the patient will be medically stable for discharge from the hospital within 2 midnights of admission.  ? ?* I certify that at the point of admission it is my clinical judgment that the patient will require inpatient hospital care spanning beyond 2 midnights from the point of admission due to high intensity of service, high risk for further deterioration and high frequency of surveillance required.* ? ?Author: Orene Desanctis, DO ?03/05/2022 9:09 PM ? ?For on call review [email or web address].  ?

## 2022-03-05 NOTE — ED Provider Notes (Signed)
?[facility] ?Provider Note ? ? ?CSN: [id number] ?Arrival date & time: 03/05/22  1215 ? ?  ? ?History ? ?Chief Complaint  ?Patient presents with  ? Abdominal Pain  ? ? ?Christian Peterson is a 51 y.o. male. ? ?Patient with history of screening colonoscopy demonstrating polyps and diverticulosis presents to the emergency department for evaluation of worsening lower abdominal pain.  Symptoms started 4 days ago.  He has had associated decreased appetite, lower abdominal cramping with occasional episodes of sharp more severe pain.  No dysuria, increased frequency or urgency, hematuria.  He has been having decreased bowel movements, small watery stools intermittently, with occasional red blood on the tissue.  He has never had symptoms like this in the past.  Denies history of abdominal surgeries.  No chest pain or shortness of breath.  Seen by PCP and referred to the emergency department for further evaluation.  Denies heavy NSAID use.  He drinks beer daily. ? ? ?  ? ?Home Medications ?Prior to Admission medications   ?Medication Sig Start Date End Date Taking? Authorizing Provider  ?famotidine (PEPCID) 20 MG tablet Take 1 tablet (20 mg total) by mouth 2 (two) times daily. 03/05/22   Saguier, Percell Miller, PA-C  ?hydrOXYzine (ATARAX/VISTARIL) 10 MG tablet 1-2 tab po q 8 hours as needed itching. ?Patient not taking: Reported on 08/13/2020 06/10/20   Saguier, Percell Miller, PA-C  ?naproxen (NAPROSYN) 500 MG tablet Take 1 tablet (500 mg total) by mouth 2 (two) times daily. ?Patient not taking: Reported on 9/32/3557 02/04/01   Delora Fuel, MD  ?ondansetron [facility]) 4 MG tablet Take 1 tablet (4 mg total) by mouth every 8 (eight) hours as needed for nausea or vomiting. 03/05/22   Saguier, Percell Miller, PA-C  ?traMADol (ULTRAM) 50 MG tablet Take 1 tablet (50 mg total) by mouth every 6 (six) hours as needed. ?Patient not taking: Reported on 5/42/7062 3/76/28   Delora Fuel, MD  ?   ? ?Allergies    ?Patient has no known  allergies.   ? ?Review of Systems   ?Review of Systems ? ?Physical Exam ?Updated Vital Signs ?BP (!) 191/116   Pulse 95   Temp 98.5 ?F (36.9 ?C) (Oral)   Resp 18   Ht '5\' 8"'$  (1.727 m)   Wt 72.6 kg   SpO2 97%   BMI 24.33 kg/m?  ?Physical Exam ?Vitals and nursing note reviewed.  ?Constitutional:   ?   General: He is not in acute distress. ?   Appearance: He is well-developed.  ?HENT:  ?   Head: Normocephalic and atraumatic.  ?Eyes:  ?   General:     ?   Right eye: No discharge.     ?   Left eye: No discharge.  ?   Conjunctiva/sclera: Conjunctivae normal.  ?Cardiovascular:  ?   Rate and Rhythm: Normal rate and regular rhythm.  ?   Heart sounds: Normal heart sounds.  ?Pulmonary:  ?   Effort: Pulmonary effort is normal.  ?   Breath sounds: Normal breath sounds.  ?Abdominal:  ?   Palpations: Abdomen is soft.  ?   Tenderness: There is abdominal tenderness in the right lower quadrant, suprapubic area and left lower quadrant. There is guarding. There is no rebound. Negative signs include Murphy's sign and McBurney's sign.  ?Musculoskeletal:  ?   Cervical back: Normal range of motion and neck supple.  ?Skin: ?   General: Skin is warm and dry.  ?Neurological:  ?   Mental  Status: He is alert.  ? ? ?ED Results / Procedures / Treatments   ?Labs ?(all labs ordered are listed, but only abnormal results are displayed) ?Labs Reviewed  ?COMPREHENSIVE METABOLIC PANEL - Abnormal; Notable for the following components:  ?    Result Value  ? Glucose, Bld 110 (*)   ? AST 51 (*)   ? ALT 53 (*)   ? All other components within normal limits  ?CBC - Abnormal; Notable for the following components:  ? WBC 12.3 (*)   ? All other components within normal limits  ?LIPASE, BLOOD  ?URINALYSIS, ROUTINE W REFLEX MICROSCOPIC  ? ? ?EKG ?EKG Interpretation ? ?Date/Time:  Thursday March 05 2022 12:27:22 EDT ?Ventricular Rate:  108 ?PR Interval:  132 ?QRS Duration: 96 ?QT Interval:  330 ?QTC Calculation: 443 ?R Axis:   75 ?Text Interpretation: Sinus  tachycardia Right atrial enlargement no prior ECG for comparison. No STEMI Confirmed by Antony Blackbird [phone]) on 03/05/2022 1:50:53 PM ? ?Radiology ?CT ABDOMEN PELVIS W CONTRAST ? ?Result Date: 03/05/2022 ?CLINICAL DATA:  Left lower quadrant abdominal pain EXAM: CT ABDOMEN AND PELVIS WITH CONTRAST TECHNIQUE: Multidetector CT imaging of the abdomen and pelvis was performed using the standard protocol following bolus administration of intravenous contrast. RADIATION DOSE REDUCTION: This exam was performed according to the departmental dose-optimization program which includes automated exposure control, adjustment of the mA and/or kV according to patient size and/or use of iterative reconstruction technique. CONTRAST:  158m OMNIPAQUE IOHEXOL 300 MG/ML  SOLN COMPARISON:  10/20/2013 FINDINGS: Lower chest: Included lung bases are clear.  Heart size is normal. Hepatobiliary: No focal liver abnormality is seen. No gallstones, gallbladder wall thickening, or biliary dilatation. Pancreas: Stable mild dilation of the main pancreatic duct. No focal parenchymal abnormality. No peripancreatic inflammatory changes. Spleen: Normal in size without focal abnormality. Adrenals/Urinary Tract: Unremarkable adrenal glands. Punctate 2 mm nonobstructing stone at the upper pole of the left kidney. Kidneys enhance symmetrically. No solid lesion or hydronephrosis. Urinary bladder is within normal limits. Stomach/Bowel: Acute diverticulitis of the mid sigmoid colon containing multiple prominent diverticula. Relatively short segment of bowel wall thickening with surrounding pericolonic fat stranding and trace fluid. There are a few small foci of extraluminal air adjacent to the inflamed segment compatible with micro perforation (series 2, image 57). Multiple fluid-filled, mildly dilated loops of small bowel throughout the abdomen without abrupt transition point, likely a reactive enteritis or ileus. Normal appendix in the right lower quadrant.  Small hiatal hernia. Stomach otherwise unremarkable. Vascular/Lymphatic: Scattered aortoiliac atherosclerotic calcifications without aneurysm. No abdominopelvic lymphadenopathy. Reproductive: Prostate is unremarkable. Other: No significant ascites or organized fluid collection. No abdominal wall abnormality. Musculoskeletal: No acute or significant osseous findings. IMPRESSION: 1. Acute sigmoid diverticulitis with microperforation. 2. Multiple fluid-filled, mildly dilated loops of small bowel throughout the abdomen without abrupt transition point, likely a reactive enteritis or ileus. 3. Nonobstructing left nephrolithiasis. Aortic Atherosclerosis (ICD10-I70.0). These results were called by telephone at the time of interpretation on 03/05/2022 at 2:13 pm to provider Tegler, who verbally acknowledged these results. Electronically Signed   By: NDavina PokeD.O.   On: 03/05/2022 14:14   ? ?Procedures ?Procedures  ? ? ?Medications Ordered in ED ?Medications  ?0.9 %  sodium chloride infusion ( Intravenous New Bag/Given 03/05/22 1438)  ?piperacillin-tazobactam (ZOSYN) IVPB 3.375 g (has no administration in time range)  ?sodium chloride 0.9 % bolus 1,000 mL ( Intravenous Stopped 03/05/22 1410)  ?morphine (PF) 4 MG/ML injection 4 mg (4 mg Intravenous Given 03/05/22  1250)  ?ondansetron (ZOFRAN) injection 4 mg (4 mg Intravenous Given 03/05/22 1250)  ?iohexol (OMNIPAQUE) 300 MG/ML solution 100 mL (100 mLs Intravenous Contrast Given 03/05/22 1357)  ?piperacillin-tazobactam (ZOSYN) IVPB 3.375 g (3.375 g Intravenous New Bag/Given 03/05/22 1438)  ?HYDROmorphone (DILAUDID) injection 0.5 mg (0.5 mg Intravenous Given 03/05/22 1537)  ? ? ?ED Course/ Medical Decision Making/ A&P ?  ? ?Patient seen and examined. History obtained directly from patient, PCP note, and previous colonoscopy results ([place]) ? ?Labs/EKG: Ordered CBC, CMP, lipase, UA. ? ?Imaging: Ordered CT abdomen pelvis. ? ?Medications/Fluids: Ordered: IV morphine, Zofran IV  fluid bolus.  ? ?Most recent vital signs reviewed and are as follows: ?BP (!) 191/116   Pulse 95   Temp 98.5 ?F (36.9 ?C) (Oral)   Resp 18   Ht '5\' 8"'$  (1.727 m)   Wt 72.6 kg   SpO2 97%   BMI 24.33 kg/m?  ? ?Initial impr

## 2022-03-05 NOTE — Assessment & Plan Note (Signed)
1 pack daily. Declined nicotine patch. ?

## 2022-03-05 NOTE — Progress Notes (Signed)
Plan of Care Note for accepted transfer ? ?Patient: NIKKO GOLDWIRE    LKJ:[id number]  DOA: 03/05/2022    ? ?Facility requesting transfer: [facility] ?Requesting Provider: David Stall, Carlisle Cater ?Reason for transfer: Admission ?Facility course: 51 year old male with past medical history of GERD diverticulosis presents with complaint of abdominal pain ongoing for 4 days.  He also has decreased appetite as well as intermittent nausea without any vomiting.  Reports occasional hematochezia as well. ?Work-up in the ED showed leukocytosis and mildly elevated LFTs. ?CT abdomen performed which shows diverticulitis with microperforation. ?EDP discussed with general surgery who recommended medical admission.  Patient was started on IV Zosyn after discussion with [facility]. ? ?Plan of care: ?The patient is accepted for admission to [place]  unit, at [facility]. ? ? ?Author: ?Berle Mull, MD  ?03/05/2022  ?Check [email or web address] for on-call coverage. ? ?Nursing staff, Please call [place] number on Amion as soon as patient's arrival, so appropriate admitting provider can evaluate the pt.  ?

## 2022-03-05 NOTE — Telephone Encounter (Signed)
Opened in error

## 2022-03-05 NOTE — Consult Note (Signed)
? ? ? ?Christian Peterson ?August 01, 1971  ?[id number].   ? ?Requesting MD: Dr. Gerald Stabs Tegeler ?Chief Complaint/Reason for Consult: diverticulitis with a microperforation ? ?HPI:  ?This is a 51 yo white male with a history of ETOH use, GERD, and allergies who appears to also have HTN in the ED that has been having abdominal pain for the last 4 days that started on Monday.  He had drank quite a bit over the weekend so PCP was concerned about diverticulitis vs pancreatitis when he presented today due to ongoing symptoms.  He has had a decrease in his appetite with some nausea, but no emesis.  He denies any fevers or chills.  He does admit to a few small drops of blood noted on his toilet paper.  He describes his pain as a constant ache with intermittent sharp stabbing pains in the left lower quadrant.  His last colonoscopy was approximately a year or so ago with no significant findings.  Given the severity of pain the patient was having in the primary office t yesterday morning he was referred to the ED.  He went to [facility] where he has a work up showing a WBC of 12.5 and a CT scan with diverticulitis with a microperforation.  We have been asked to see him. ? ?ROS: ?ROS: Please see HPI, otherwise all other systems have been reviewed and are negative. ? ?Family History  ?Adopted: Yes  ? ? ?Past Medical History:  ?Diagnosis Date  ? Allergy   ? Cellulitis   ? R foot  ? GERD (gastroesophageal reflux disease)   ? occ with spicy foods   ? ? ?Past Surgical History:  ?Procedure Laterality Date  ? ANKLE FRACTURE SURGERY    ? plate   ? WISDOM TOOTH EXTRACTION    ? ? ?Social History:  reports that he has been smoking cigarettes. He has been smoking an average of 1 pack per day. He has never used smokeless tobacco. He reports current alcohol use. He reports current drug use. Drug: Marijuana. ? ?Allergies: No Known Allergies ? ?(Not in a hospital admission) ? ? ? ?Physical Exam: ?Blood pressure (!) 150/123, pulse 94, temperature 98.5 ?F (36.9  ?C), temperature source Oral, resp. rate 19, height '5\' 8"'$  (1.727 m), weight 72.6 kg, SpO2 97 %. ?General: pleasant, WD, WN white male who is laying in bed in NAD ?HEENT: head is normocephalic, atraumatic.  Sclera are noninjected.  PERRL.  Ears and nose without any masses or lesions.  Mouth is pink and moist ?Heart: regular, rate, and rhythm.  Normal s1,s2. No obvious murmurs, gallops, or rubs noted.  Palpable radial and pedal pulses bilaterally ?Lungs: CTAB, no wheezes, rhonchi, or rales noted.  Respiratory effort nonlabored ?Abd: soft, tender in the LLQ and some in the suprapubic region.  No guarding or peritonitis, ND, +BS, no masses, hernias, or organomegaly ?MS: all 4 extremities are symmetrical with no cyanosis, clubbing, or edema. ?Skin: warm and dry with no masses, lesions, or rashes ?Neuro: Cranial nerves 2-12 grossly intact, sensation is normal throughout ?Psych: A&Ox3 with an appropriate affect. ? ? ?Results for orders placed or performed during the hospital encounter of 03/05/22 (from the past 48 hour(s))  ?Lipase, blood     Status: None  ? Collection Time: 03/05/22 12:26 PM  ?Result Value Ref Range  ? Lipase 40 11 - 51 U/L  ?  Comment: Performed at [facility], [address] [place], [place] [address]  ?Comprehensive metabolic panel  Status: Abnormal  ? Collection Time: 03/05/22 12:26 PM  ?Result Value Ref Range  ? Sodium 139 135 - 145 mmol/L  ? Potassium 4.1 3.5 - 5.1 mmol/L  ? Chloride 102 98 - 111 mmol/L  ? CO2 27 22 - 32 mmol/L  ? Glucose, Bld 110 (H) 70 - 99 mg/dL  ?  Comment: Glucose reference range applies only to Peterson taken after fasting for at least 8 hours.  ? BUN 12 6 - 20 mg/dL  ? Creatinine, Ser 0.73 0.61 - 1.24 mg/dL  ? Calcium 9.6 8.9 - 10.3 mg/dL  ? Total Protein 8.1 6.5 - 8.1 g/dL  ? Albumin 4.2 3.5 - 5.0 g/dL  ? AST 51 (H) 15 - 41 U/L  ? ALT 53 (H) 0 - 44 U/L  ? Alkaline Phosphatase 74 38 - 126 U/L  ? Total Bilirubin 0.8 0.3 - 1.2 mg/dL  ? GFR, Estimated >60 >60  mL/min  ?  Comment: (NOTE) ?Calculated using the CKD-EPI Creatinine Equation (2021) ?  ? Anion gap 10 5 - 15  ?  Comment: Performed at [facility], [address] [place], [place] [address]  ?CBC     Status: Abnormal  ? Collection Time: 03/05/22 12:26 PM  ?Result Value Ref Range  ? WBC 12.3 (H) 4.0 - 10.5 K/uL  ? RBC 5.07 4.22 - 5.81 MIL/uL  ? Hemoglobin 16.8 13.0 - 17.0 g/dL  ? HCT 48.5 39.0 - 52.0 %  ? MCV 95.7 80.0 - 100.0 fL  ? MCH 33.1 26.0 - 34.0 pg  ? MCHC 34.6 30.0 - 36.0 g/dL  ? RDW 11.9 11.5 - 15.5 %  ? Platelets 233 150 - 400 K/uL  ? nRBC 0.0 0.0 - 0.2 %  ?  Comment: Performed at [facility], [address], [place], [place] [address]  ? ?CT ABDOMEN PELVIS W CONTRAST ? ?Result Date: 03/05/2022 ?CLINICAL DATA:  Left lower quadrant abdominal pain EXAM: CT ABDOMEN AND PELVIS WITH CONTRAST TECHNIQUE: Multidetector CT imaging of the abdomen and pelvis was performed using the standard protocol following bolus administration of intravenous contrast. RADIATION DOSE REDUCTION: This exam was performed according to the departmental dose-optimization program which includes automated exposure control, adjustment of the mA and/or kV according to patient size and/or use of iterative reconstruction technique. CONTRAST:  18m OMNIPAQUE IOHEXOL 300 MG/ML  SOLN COMPARISON:  10/20/2013 FINDINGS: Lower chest: Included lung bases are clear.  Heart size is normal. Hepatobiliary: No focal liver abnormality is seen. No gallstones, gallbladder wall thickening, or biliary dilatation. Pancreas: Stable mild dilation of the main pancreatic duct. No focal parenchymal abnormality. No peripancreatic inflammatory changes. Spleen: Normal in size without focal abnormality. Adrenals/Urinary Tract: Unremarkable adrenal glands. Punctate 2 mm nonobstructing stone at the upper pole of the left kidney. Kidneys enhance symmetrically. No solid lesion or hydronephrosis. Urinary bladder is within normal limits. Stomach/Bowel:  Acute diverticulitis of the mid sigmoid colon containing multiple prominent diverticula. Relatively short segment of bowel wall thickening with surrounding pericolonic fat stranding and trace fluid. There are a few small foci of extraluminal air adjacent to the inflamed segment compatible with micro perforation (series 2, image 57). Multiple fluid-filled, mildly dilated loops of small bowel throughout the abdomen without abrupt transition point, likely a reactive enteritis or ileus. Normal appendix in the right lower quadrant. Small hiatal hernia. Stomach otherwise unremarkable. Vascular/Lymphatic: Scattered aortoiliac atherosclerotic calcifications without aneurysm. No abdominopelvic lymphadenopathy. Reproductive: Prostate is unremarkable. Other: No significant ascites or organized  fluid collection. No abdominal wall abnormality. Musculoskeletal: No acute or significant osseous findings. IMPRESSION: 1. Acute sigmoid diverticulitis with microperforation. 2. Multiple fluid-filled, mildly dilated loops of small bowel throughout the abdomen without abrupt transition point, likely a reactive enteritis or ileus. 3. Nonobstructing left nephrolithiasis. Aortic Atherosclerosis (ICD10-I70.0). These results were called by telephone at the time of interpretation on 03/05/2022 at 2:13 pm to provider Tegler, who verbally acknowledged these results. Electronically Signed   By: Davina Poke D.O.   On: 03/05/2022 14:14   ? ? ? ?Assessment/Plan ?Diverticulitis with microperforation ?The patient has been seen and examined and his imaging and labs reviewed.  He does appear to have diverticulitis with a microperforation. He does not have signs of frank peritonitis so no current indications for emergency surgery.  He has been admitted to the hospital and treatment start with conservative management.  Initially he has been started on IV Zosyn.  We will allow clear liquids today as his pain is not severe.  His white blood cell count  has actually already normalized today and he is afebrile.  If the patient fails to improve or acutely worsens, we have discussed the need for possible surgical intervention potentially requiring a colosto

## 2022-03-05 NOTE — ED Triage Notes (Signed)
Pt arrives pov, steady gait c/o lower abdominal pain x 4 days. Endorses nausea, decreased stool output. Referred by pcp for additional testing today ?

## 2022-03-05 NOTE — Patient Instructions (Signed)
Moderate periumbilical, suprapubic and left lower quadrant region pain described since Monday.  However during exam and interview appears to be having more severe type pain.  12 beers over the weekend causes consideration for pancreatitis.  On review of prior colonoscopy diverticula seen so considering diverticulitis in differential diagnosis. ? ?Consider outpatient work-up initially but I think is best to be seen to the emergency department to expedite a work-up and potentially receive IV hydration as well as pain medication. ? ?I did talk with emergency department physician and informed him of recent signs and symptoms. ? ?Follow-up date to be determined after review of emergency department work-up. ?

## 2022-03-05 NOTE — Progress Notes (Signed)
? ?  Subjective:  ? ? Patient ID: Christian Peterson, male    DOB: [birth year], 51 y.o.   MRN: [id number] ? ?HPI ?Pt had pain since Monday morning at 1:30 am. He woke up with pain. Pain was severe at that time. Pt states on Tuesday he ate rice and had minimal loose stool. Pt last normal bowel movement was Sunday. No preceding constipation. Sunday night had japanese food. Pt had 12 beers thoughout the weekend. Pt has had some sweats this morning. He states sweat beaded up on forehead this morning. Pt has had some nausea over past 3 days but no vomiting. Some dry heaving at times but states can't vomit since nothing in stomach. ? ?Also states last weekend was drinking intermittently as well. ? ? ?Pt went to UC yesterday. Pt states told constipation but no work up done. ? ?Pt states pain level 3/10 presently.  ? ? ?Multiple diverticula were found in the left colon. ? ? ?Review of Systems  ?Constitutional:  Negative for chills, fatigue and fever.  ?Respiratory:  Negative for choking, shortness of breath and wheezing.   ?Cardiovascular:  Negative for chest pain and palpitations.  ?Gastrointestinal:  Positive for abdominal pain and nausea. Negative for vomiting.  ?Genitourinary:  Negative for dysuria, flank pain and frequency.  ?Musculoskeletal:  Negative for back pain.  ?Skin:  Negative for rash.  ? ?   ?Objective:  ? Physical Exam ?General- No acute distress. Pleasant patient. ?Neck- Full range of motion, no jvd ?Lungs- Clear, even and unlabored. ?Heart- regular rate and rhythm. ?Neurologic- CNII- XII grossly intact.  ?Abdomen-moderate periumbilical, suprapubic and left lower quadrant tender to palpation.  Positive bowel sounds.  No rebound or guarding present.  No heel jar pain. ?Back-no CVA tenderness. ? ? ? ?   ?Assessment & Plan:  ? ?Patient Instructions  ?Moderate periumbilical, suprapubic and left lower quadrant region pain described since Monday.  However during exam and interview appears to be having more severe type  pain.  12 beers over the weekend causes consideration for pancreatitis.  On review of prior colonoscopy diverticula seen so considering diverticulitis in differential diagnosis. ? ?Consider outpatient work-up initially but I think is best to be seen to the emergency department to expedite a work-up and potentially receive IV hydration as well as pain medication. ? ?I did talk with emergency department physician and informed him of recent signs and symptoms. ? ?Follow-up date to be determined after review of emergency department work-up.  ? ? ?Mackie Pai, PA-C  ?

## 2022-03-06 DIAGNOSIS — K5720 Diverticulitis of large intestine with perforation and abscess without bleeding: Secondary | ICD-10-CM | POA: Diagnosis not present

## 2022-03-06 DIAGNOSIS — F1090 Alcohol use, unspecified, uncomplicated: Secondary | ICD-10-CM | POA: Diagnosis not present

## 2022-03-06 DIAGNOSIS — R030 Elevated blood-pressure reading, without diagnosis of hypertension: Secondary | ICD-10-CM | POA: Diagnosis not present

## 2022-03-06 DIAGNOSIS — Z720 Tobacco use: Secondary | ICD-10-CM | POA: Diagnosis not present

## 2022-03-06 LAB — CBC
HCT: 41.8 % (ref 39.0–52.0)
Hemoglobin: 14.3 g/dL (ref 13.0–17.0)
MCH: 33.6 pg (ref 26.0–34.0)
MCHC: 34.2 g/dL (ref 30.0–36.0)
MCV: 98.1 fL (ref 80.0–100.0)
Platelets: 170 10*3/uL (ref 150–400)
RBC: 4.26 MIL/uL (ref 4.22–5.81)
RDW: 11.9 % (ref 11.5–15.5)
WBC: 6.9 10*3/uL (ref 4.0–10.5)
nRBC: 0 % (ref 0.0–0.2)

## 2022-03-06 LAB — HIV ANTIBODY (ROUTINE TESTING W REFLEX): HIV Screen 4th Generation wRfx: NONREACTIVE

## 2022-03-06 MED ORDER — ENOXAPARIN SODIUM 40 MG/0.4ML IJ SOSY
40.0000 mg | PREFILLED_SYRINGE | Freq: Every day | INTRAMUSCULAR | Status: DC
Start: 2022-03-06 — End: 2022-03-08
  Filled 2022-03-06 (×2): qty 0.4

## 2022-03-06 MED ORDER — SODIUM CHLORIDE 0.9 % IV SOLN: INTRAVENOUS | Status: DC

## 2022-03-06 MED ORDER — ACETAMINOPHEN 500 MG PO TABS
1000.0000 mg | ORAL_TABLET | Freq: Four times a day (QID) | ORAL | Status: DC
Start: 2022-03-06 — End: 2022-03-08
  Administered 2022-03-06 – 2022-03-07 (×4): 1000 mg via ORAL
  Filled 2022-03-06 (×7): qty 2

## 2022-03-06 MED ORDER — TRAMADOL HCL 50 MG PO TABS
50.0000 mg | ORAL_TABLET | Freq: Four times a day (QID) | ORAL | Status: DC | PRN
  Administered 2022-03-06 (×2): 50 mg via ORAL
  Filled 2022-03-06 (×3): qty 1

## 2022-03-06 NOTE — Progress Notes (Signed)
?PROGRESS NOTE ? ? ? ?Christian Peterson  HXT:[id number] DOB: 09-19-1971 DOA: 03/05/2022 ?PCP: Mackie Pai, PA-C  ? ?Brief Narrative:  ?51 year old male with history of GERD and diverticulosis presented from outside ED for acute diverticulitis with microperforation.  In the ED, patient was hypertensive, tachycardic with WBC of 12.3, AST 51, ALT 53. CT of the abdomen pelvis showed diverticulitis with microperforation and likely enteritis/ileitis of the small bowel.  General surgery was consulted.  He was started on IV fluids and antibiotics. ? ?Assessment & Plan: ?  ?Acute sigmoid diverticulitis with microperforation ?-Continue IV fluids and Zosyn.  Continue pain management and antiemetics as needed. ?-Follow general surgery recommendations.  Diet advancement as per general surgery ? ?Leukocytosis ?-Resolved ? ?Mild elevated LFTs ?-Possibly from alcohol use.  Monitor ? ?Possible hypertension ?-No formal diagnosis of hypertension.  Blood pressure remains elevated.  Continue monitoring and use IV labetalol as needed. ? ?Alcohol use ?-Reported to drink 3-4 beers nightly.  Currently no signs of withdrawal.  Monitor ? ?Tobacco abuse ?-Apparently smokes 1 pack daily.  Patient apparently declined nicotine patch. ? ? ? ? ?DVT prophylaxis: SCDs ?Code Status: Full ?Family Communication: None at bedside ?Disposition Plan: ?Status is: Inpatient ?Remains inpatient appropriate because: Of severity of illness.  Need for IV fluids and antibiotics ? ?Consultants: General surgery ? ?Procedures: None ? ?Antimicrobials: Zosyn from 03/05/2022 onwards ? ? ?Subjective: ?Patient seen and examined at bedside.  Complains of intermittent lower abdominal pain.  Denies any fever, vomiting.  No chest pain or shortness of breath reported. ? ?Objective: ?Vitals:  ? 03/05/22 2100 03/06/22 0138 03/06/22 0302 03/06/22 0606  ?BP: (!) 153/89 (!) 170/93 (!) 156/94 (!) 173/97  ?Pulse: 90 78 73 74  ?Resp: '17 17 17 17  '$ ?Temp: 98.2 ?F (36.8 ?C) 97.7 ?F (36.5  ?C) 97.8 ?F (36.6 ?C) 97.9 ?F (36.6 ?C)  ?TempSrc: Oral Oral Oral Oral  ?SpO2: 96% 96% 96% 96%  ?Weight:    67.3 kg  ?Height:      ? ? ?Intake/Output Summary (Last 24 hours) at 03/06/2022 0814 ?Last data filed at 03/06/2022 0600 ?Gross per 24 hour  ?Intake 1559.06 ml  ?Output --  ?Net 1559.06 ml  ? ?Filed Weights  ? 03/05/22 1223 03/06/22 0606  ?Weight: 72.6 kg 67.3 kg  ? ? ?Examination: ? ?General exam: Appears calm and comfortable.  Currently on room air ?Respiratory system: Bilateral decreased breath sounds at bases ?Cardiovascular system: S1 & S2 heard, Rate controlled ?Gastrointestinal system: Abdomen is nondistended, soft and mildly tender in the lower quadrant.  No rebound tenderness present.  Normal bowel sounds heard. ?Extremities: No cyanosis, clubbing, edema  ?Central nervous system: Alert and oriented. No focal neurological deficits. Moving extremities ?Skin: No rashes, lesions or ulcers ?Psychiatry: Judgement and insight appear normal. Mood & affect appropriate.  ? ? ? ?Data Reviewed: I have personally reviewed following labs and imaging studies ? ?CBC: ?Recent Labs  ?Lab 03/05/22 ?1226 03/06/22 ?1655  ?WBC 12.3* 6.9  ?HGB 16.8 14.3  ?HCT 48.5 41.8  ?MCV 95.7 98.1  ?PLT 233 170  ? ?Basic Metabolic Panel: ?Recent Labs  ?Lab 03/05/22 ?1226  ?NA 139  ?K 4.1  ?CL 102  ?CO2 27  ?GLUCOSE 110*  ?BUN 12  ?CREATININE 0.73  ?CALCIUM 9.6  ? ?GFR: ?Estimated Creatinine Clearance: 102.8 mL/min (by C-G formula based on SCr of 0.73 mg/dL). ?Liver Function Tests: ?Recent Labs  ?Lab 03/05/22 ?1226  ?AST 51*  ?ALT 53*  ?ALKPHOS 74  ?BILITOT 0.8  ?  PROT 8.1  ?ALBUMIN 4.2  ? ?Recent Labs  ?Lab 03/05/22 ?1226  ?LIPASE 40  ? ?No results for input(s): AMMONIA in the last 168 hours. ?Coagulation Profile: ?No results for input(s): INR, PROTIME in the last 168 hours. ?Cardiac Enzymes: ?No results for input(s): CKTOTAL, CKMB, CKMBINDEX, TROPONINI in the last 168 hours. ?BNP (last 3 results) ?No results for input(s): PROBNP in the  last 8760 hours. ?HbA1C: ?No results for input(s): HGBA1C in the last 72 hours. ?CBG: ?No results for input(s): GLUCAP in the last 168 hours. ?Lipid Profile: ?No results for input(s): CHOL, HDL, LDLCALC, TRIG, CHOLHDL, LDLDIRECT in the last 72 hours. ?Thyroid Function Tests: ?No results for input(s): TSH, T4TOTAL, FREET4, T3FREE, THYROIDAB in the last 72 hours. ?Anemia Panel: ?No results for input(s): VITAMINB12, FOLATE, FERRITIN, TIBC, IRON, RETICCTPCT in the last 72 hours. ?Sepsis Labs: ?No results for input(s): PROCALCITON, LATICACIDVEN in the last 168 hours. ? ?No results found for this or any previous visit (from the past 240 hour(s)).  ? ? ? ? ? ?Radiology Studies: ?CT ABDOMEN PELVIS W CONTRAST ? ?Result Date: 03/05/2022 ?CLINICAL DATA:  Left lower quadrant abdominal pain EXAM: CT ABDOMEN AND PELVIS WITH CONTRAST TECHNIQUE: Multidetector CT imaging of the abdomen and pelvis was performed using the standard protocol following bolus administration of intravenous contrast. RADIATION DOSE REDUCTION: This exam was performed according to the departmental dose-optimization program which includes automated exposure control, adjustment of the mA and/or kV according to patient size and/or use of iterative reconstruction technique. CONTRAST:  131m OMNIPAQUE IOHEXOL 300 MG/ML  SOLN COMPARISON:  10/20/2013 FINDINGS: Lower chest: Included lung bases are clear.  Heart size is normal. Hepatobiliary: No focal liver abnormality is seen. No gallstones, gallbladder wall thickening, or biliary dilatation. Pancreas: Stable mild dilation of the main pancreatic duct. No focal parenchymal abnormality. No peripancreatic inflammatory changes. Spleen: Normal in size without focal abnormality. Adrenals/Urinary Tract: Unremarkable adrenal glands. Punctate 2 mm nonobstructing stone at the upper pole of the left kidney. Kidneys enhance symmetrically. No solid lesion or hydronephrosis. Urinary bladder is within normal limits. Stomach/Bowel:  Acute diverticulitis of the mid sigmoid colon containing multiple prominent diverticula. Relatively short segment of bowel wall thickening with surrounding pericolonic fat stranding and trace fluid. There are a few small foci of extraluminal air adjacent to the inflamed segment compatible with micro perforation (series 2, image 57). Multiple fluid-filled, mildly dilated loops of small bowel throughout the abdomen without abrupt transition point, likely a reactive enteritis or ileus. Normal appendix in the right lower quadrant. Small hiatal hernia. Stomach otherwise unremarkable. Vascular/Lymphatic: Scattered aortoiliac atherosclerotic calcifications without aneurysm. No abdominopelvic lymphadenopathy. Reproductive: Prostate is unremarkable. Other: No significant ascites or organized fluid collection. No abdominal wall abnormality. Musculoskeletal: No acute or significant osseous findings. IMPRESSION: 1. Acute sigmoid diverticulitis with microperforation. 2. Multiple fluid-filled, mildly dilated loops of small bowel throughout the abdomen without abrupt transition point, likely a reactive enteritis or ileus. 3. Nonobstructing left nephrolithiasis. Aortic Atherosclerosis (ICD10-I70.0). These results were called by telephone at the time of interpretation on 03/05/2022 at 2:13 pm to provider Tegler, who verbally acknowledged these results. Electronically Signed   By: NDavina PokeD.O.   On: 03/05/2022 14:14   ? ? ? ? ? ?Scheduled Meds: ? ?Continuous Infusions: ? sodium chloride 10 mL/hr at 03/05/22 1438  ? sodium chloride 75 mL/hr at 03/05/22 2155  ? piperacillin-tazobactam (ZOSYN)  IV 3.375 g (03/06/22 0510)  ? ? ? ? ? ? ? ? ?KAline August MD ?[facility] ?03/06/2022,  8:14 AM  ? ?

## 2022-03-06 NOTE — Plan of Care (Signed)

## 2022-03-06 NOTE — Plan of Care (Signed)
Ongoing sharp non-radiating abdominal pain requiring multiple PRNs. Seen by surgical team, no imminent procedures at this time. Advanced to and tolerating CLD. SBP 173, PRN labetalol administered. Incentive spirometer use encouraged.  ? ? ?Problem: Education: ?Goal: Knowledge of General Education information will improve ?Description: Including pain rating scale, medication(s)/side effects and non-pharmacologic comfort measures ?Outcome: Progressing ?  ?Problem: Health Behavior/Discharge Planning: ?Goal: Ability to manage health-related needs will improve ?Outcome: Progressing ?  ?Problem: Clinical Measurements: ?Goal: Ability to maintain clinical measurements within normal limits will improve ?Outcome: Progressing ?Goal: Will remain free from infection ?Outcome: Progressing ?Goal: Diagnostic test results will improve ?Outcome: Progressing ?Goal: Respiratory complications will improve ?Outcome: Progressing ?Goal: Cardiovascular complication will be avoided ?Outcome: Progressing ?  ?Problem: Activity: ?Goal: Risk for activity intolerance will decrease ?Outcome: Progressing ?  ?Problem: Nutrition: ?Goal: Adequate nutrition will be maintained ?Outcome: Progressing ?  ?Problem: Coping: ?Goal: Level of anxiety will decrease ?Outcome: Progressing ?  ?Problem: Elimination: ?Goal: Will not experience complications related to bowel motility ?Outcome: Progressing ?Goal: Will not experience complications related to urinary retention ?Outcome: Progressing ?  ?Problem: Pain Managment: ?Goal: General experience of comfort will improve ?Outcome: Progressing ?  ?Problem: Safety: ?Goal: Ability to remain free from injury will improve ?Outcome: Progressing ?  ?Problem: Skin Integrity: ?Goal: Risk for impaired skin integrity will decrease ?Outcome: Progressing ?  ?

## 2022-03-06 NOTE — Progress Notes (Signed)
Transition of Care (TOC) Screening Note ? ?Patient Details  ?Name: Christian Peterson ?Date of Birth: [birth year] ? ?Transition of Care (TOC) CM/SW Contact:    ?Sherie Don, LCSW ?Phone Number: ?03/06/2022, 9:42 AM ? ?Transition of Care Department [facility]) has reviewed patient and no TOC needs have been identified at this time. We will continue to monitor patient advancement through interdisciplinary progression rounds. If new patient transition needs arise, please place a TOC consult. ?

## 2022-03-07 DIAGNOSIS — Z720 Tobacco use: Secondary | ICD-10-CM | POA: Diagnosis not present

## 2022-03-07 DIAGNOSIS — K5720 Diverticulitis of large intestine with perforation and abscess without bleeding: Secondary | ICD-10-CM | POA: Diagnosis not present

## 2022-03-07 DIAGNOSIS — F1090 Alcohol use, unspecified, uncomplicated: Secondary | ICD-10-CM | POA: Diagnosis not present

## 2022-03-07 DIAGNOSIS — R030 Elevated blood-pressure reading, without diagnosis of hypertension: Secondary | ICD-10-CM | POA: Diagnosis not present

## 2022-03-07 LAB — CBC WITH DIFFERENTIAL/PLATELET
Abs Immature Granulocytes: 0.04 10*3/uL (ref 0.00–0.07)
Basophils Absolute: 0.1 10*3/uL (ref 0.0–0.1)
Basophils Relative: 1 %
Eosinophils Absolute: 0.2 10*3/uL (ref 0.0–0.5)
Eosinophils Relative: 4 %
HCT: 41.6 % (ref 39.0–52.0)
Hemoglobin: 14.3 g/dL (ref 13.0–17.0)
Immature Granulocytes: 1 %
Lymphocytes Relative: 12 %
Lymphs Abs: 0.8 10*3/uL (ref 0.7–4.0)
MCH: 33.3 pg (ref 26.0–34.0)
MCHC: 34.4 g/dL (ref 30.0–36.0)
MCV: 97 fL (ref 80.0–100.0)
Monocytes Absolute: 0.7 10*3/uL (ref 0.1–1.0)
Monocytes Relative: 12 %
Neutro Abs: 4.5 10*3/uL (ref 1.7–7.7)
Neutrophils Relative %: 70 %
Platelets: 169 10*3/uL (ref 150–400)
RBC: 4.29 MIL/uL (ref 4.22–5.81)
RDW: 11.7 % (ref 11.5–15.5)
WBC: 6.3 10*3/uL (ref 4.0–10.5)
nRBC: 0 % (ref 0.0–0.2)

## 2022-03-07 LAB — COMPREHENSIVE METABOLIC PANEL
ALT: 35 U/L (ref 0–44)
AST: 26 U/L (ref 15–41)
Albumin: 3.3 g/dL — ABNORMAL LOW (ref 3.5–5.0)
Alkaline Phosphatase: 53 U/L (ref 38–126)
Anion gap: 7 (ref 5–15)
BUN: 7 mg/dL (ref 6–20)
CO2: 26 mmol/L (ref 22–32)
Calcium: 8.6 mg/dL — ABNORMAL LOW (ref 8.9–10.3)
Chloride: 102 mmol/L (ref 98–111)
Creatinine, Ser: 0.64 mg/dL (ref 0.61–1.24)
GFR, Estimated: >60 mL/min (ref >60)
Glucose, Bld: 92 mg/dL (ref 70–99)
Potassium: 3.3 mmol/L — ABNORMAL LOW (ref 3.5–5.1)
Sodium: 135 mmol/L (ref 135–145)
Total Bilirubin: 0.7 mg/dL (ref 0.3–1.2)
Total Protein: 6.6 g/dL (ref 6.5–8.1)

## 2022-03-07 LAB — BASIC METABOLIC PANEL
Anion gap: 6 (ref 5–15)
BUN: 6 mg/dL (ref 6–20)
CO2: 25 mmol/L (ref 22–32)
Calcium: 8.7 mg/dL — ABNORMAL LOW (ref 8.9–10.3)
Chloride: 103 mmol/L (ref 98–111)
Creatinine, Ser: 0.64 mg/dL (ref 0.61–1.24)
GFR, Estimated: >60 mL/min (ref >60)
Glucose, Bld: 141 mg/dL — ABNORMAL HIGH (ref 70–99)
Potassium: 3.7 mmol/L (ref 3.5–5.1)
Sodium: 134 mmol/L — ABNORMAL LOW (ref 135–145)

## 2022-03-07 LAB — MAGNESIUM: Magnesium: 2 mg/dL (ref 1.7–2.4)

## 2022-03-07 MED ORDER — AMLODIPINE BESYLATE 5 MG PO TABS
5.0000 mg | ORAL_TABLET | Freq: Every day | ORAL | Status: DC
  Administered 2022-03-07: 5 mg via ORAL
  Filled 2022-03-07: qty 1

## 2022-03-07 MED ORDER — POTASSIUM CHLORIDE CRYS ER 20 MEQ PO TBCR
40.0000 meq | EXTENDED_RELEASE_TABLET | Freq: Once | ORAL | Status: AC
Start: 2022-03-07 — End: 2022-03-07
  Administered 2022-03-07: 40 meq via ORAL
  Filled 2022-03-07: qty 2

## 2022-03-07 NOTE — Plan of Care (Signed)
  Problem: Education: Goal: Knowledge of General Education information will improve Description: Including pain rating scale, medication(s)/side effects and non-pharmacologic comfort measures Outcome: Progressing   Problem: Coping: Goal: Level of anxiety will decrease Outcome: Progressing   Problem: Elimination: Goal: Will not experience complications related to bowel motility Outcome: Progressing   Problem: Pain Managment: Goal: General experience of comfort will improve Outcome: Progressing   

## 2022-03-07 NOTE — Progress Notes (Signed)
? ?Subjective/Chief Complaint: ?No complaints. Feels much better ? ? ?Objective: ?Vital signs in last 24 hours: ?Temp:  [97.5 ?F (36.4 ?C)-97.7 ?F (36.5 ?C)] 97.6 ?F (36.4 ?C) (04/22 0507) ?Pulse Rate:  [66-79] 72 (04/22 0641) ?Resp:  [15-19] 17 (04/22 0507) ?BP: (155-186)/(92-111) 159/92 (04/22 0641) ?SpO2:  [96 %-99 %] 96 % (04/22 0507) ?Weight:  [74.1 kg] 74.1 kg (04/22 0507) ?Last BM Date : 03/06/22 ? ?Intake/Output from previous day: ?04/21 0701 - 04/22 0700 ?In: 1698.4 [P.O.:680; I.V.:828.3; IV Piggyback:190.1] ?Out: -  ?Intake/Output this shift: ?No intake/output data recorded. ? ?General appearance: alert and cooperative ?Resp: clear to auscultation bilaterally ?Cardio: regular rate and rhythm ?GI: soft, mild tenderness LLQ ? ?Lab Results:  ?Recent Labs  ?  03/06/22 ?4818 03/07/22 ?0304  ?WBC 6.9 6.3  ?HGB 14.3 14.3  ?HCT 41.8 41.6  ?PLT 170 169  ? ?BMET ?Recent Labs  ?  03/05/22 ?1226 03/07/22 ?0304  ?NA 139 135  ?K 4.1 3.3*  ?CL 102 102  ?CO2 27 26  ?GLUCOSE 110* 92  ?BUN 12 7  ?CREATININE 0.73 0.64  ?CALCIUM 9.6 8.6*  ? ?PT/INR ?No results for input(s): LABPROT, INR in the last 72 hours. ?ABG ?No results for input(s): PHART, HCO3 in the last 72 hours. ? ?Invalid input(s): PCO2, PO2 ? ?Studies/Results: ?CT ABDOMEN PELVIS W CONTRAST ? ?Result Date: 03/05/2022 ?CLINICAL DATA:  Left lower quadrant abdominal pain EXAM: CT ABDOMEN AND PELVIS WITH CONTRAST TECHNIQUE: Multidetector CT imaging of the abdomen and pelvis was performed using the standard protocol following bolus administration of intravenous contrast. RADIATION DOSE REDUCTION: This exam was performed according to the departmental dose-optimization program which includes automated exposure control, adjustment of the mA and/or kV according to patient size and/or use of iterative reconstruction technique. CONTRAST:  145m OMNIPAQUE IOHEXOL 300 MG/ML  SOLN COMPARISON:  10/20/2013 FINDINGS: Lower chest: Included lung bases are clear.  Heart size is  normal. Hepatobiliary: No focal liver abnormality is seen. No gallstones, gallbladder wall thickening, or biliary dilatation. Pancreas: Stable mild dilation of the main pancreatic duct. No focal parenchymal abnormality. No peripancreatic inflammatory changes. Spleen: Normal in size without focal abnormality. Adrenals/Urinary Tract: Unremarkable adrenal glands. Punctate 2 mm nonobstructing stone at the upper pole of the left kidney. Kidneys enhance symmetrically. No solid lesion or hydronephrosis. Urinary bladder is within normal limits. Stomach/Bowel: Acute diverticulitis of the mid sigmoid colon containing multiple prominent diverticula. Relatively short segment of bowel wall thickening with surrounding pericolonic fat stranding and trace fluid. There are a few small foci of extraluminal air adjacent to the inflamed segment compatible with micro perforation (series 2, image 57). Multiple fluid-filled, mildly dilated loops of small bowel throughout the abdomen without abrupt transition point, likely a reactive enteritis or ileus. Normal appendix in the right lower quadrant. Small hiatal hernia. Stomach otherwise unremarkable. Vascular/Lymphatic: Scattered aortoiliac atherosclerotic calcifications without aneurysm. No abdominopelvic lymphadenopathy. Reproductive: Prostate is unremarkable. Other: No significant ascites or organized fluid collection. No abdominal wall abnormality. Musculoskeletal: No acute or significant osseous findings. IMPRESSION: 1. Acute sigmoid diverticulitis with microperforation. 2. Multiple fluid-filled, mildly dilated loops of small bowel throughout the abdomen without abrupt transition point, likely a reactive enteritis or ileus. 3. Nonobstructing left nephrolithiasis. Aortic Atherosclerosis (ICD10-I70.0). These results were called by telephone at the time of interpretation on 03/05/2022 at 2:13 pm to provider Tegler, who verbally acknowledged these results. Electronically Signed   By:  NDavina PokeD.O.   On: 03/05/2022 14:14   ? ?Anti-infectives: ?Anti-infectives (From admission, onward)  ? ?  Start     Dose/Rate Route Frequency Ordered Stop  ? 03/05/22 2200  piperacillin-tazobactam (ZOSYN) IVPB 3.375 g       ? 3.375 g ?12.5 mL/hr over 240 Minutes Intravenous Every 8 hours 03/05/22 1522    ? 03/05/22 1530  piperacillin-tazobactam (ZOSYN) IVPB 3.375 g  Status:  Discontinued       ? 3.375 g ?100 mL/hr over 30 Minutes Intravenous Every 8 hours 03/05/22 1520 03/05/22 1522  ? 03/05/22 1430  piperacillin-tazobactam (ZOSYN) IVPB 3.375 g       ? 3.375 g ?100 mL/hr over 30 Minutes Intravenous  Once 03/05/22 1418 03/05/22 1605  ? ?  ? ? ?Assessment/Plan: ?s/p * No surgery found * ?Advance diet. Start fulls today ?Continue IV zosyn another day then switch to orals ?Wbc normal ?Diverticulitis with microperf. improving ? ? LOS: 2 days  ? ? ?Autumn Messing III ?03/07/2022 ? ?

## 2022-03-07 NOTE — Plan of Care (Signed)

## 2022-03-07 NOTE — Progress Notes (Signed)
?PROGRESS NOTE ? ? ? ?Christian Peterson  OEU:[id number] DOB: June 11, 1971 DOA: 03/05/2022 ?PCP: Mackie Pai, PA-C  ? ?Brief Narrative:  ?51 year old male with history of GERD and diverticulosis presented from outside ED for acute diverticulitis with microperforation.  In the ED, patient was hypertensive, tachycardic with WBC of 12.3, AST 51, ALT 53. CT of the abdomen pelvis showed diverticulitis with microperforation and likely enteritis/ileitis of the small bowel.  General surgery was consulted.  He was started on IV fluids and antibiotics. ? ?Assessment & Plan: ?  ?Acute sigmoid diverticulitis with microperforation ?-Continue IV fluids and Zosyn.  Continue pain management and antiemetics as needed. ?-Follow general surgery recommendations.  Diet advancement as per general surgery ? ?Leukocytosis ?-Resolved ? ?Mild elevated LFTs ?-Possibly from alcohol use.  Resolved.   ? ?Possible hypertension ?-No formal diagnosis of hypertension.  Blood pressure remains elevated.  Continue monitoring and use IV labetalol as needed. ? ?Hypokalemia ?-Replace.  ? ?Alcohol use ?-Reported to drink 3-4 beers nightly.  Currently no signs of withdrawal.  Monitor ? ?Tobacco abuse ?-Apparently smokes 1 pack daily.  Patient apparently declined nicotine patch. ? ? ? ? ?DVT prophylaxis: Lovenox  ?code Status: Full ?Family Communication: None at bedside ?Disposition Plan: ?Status is: Inpatient ?Remains inpatient appropriate because: Of severity of illness.  Need for IV fluids and antibiotics ? ?Consultants: General surgery ? ?Procedures: None ? ?Antimicrobials: Zosyn from 03/05/2022 onwards ? ? ?Subjective: ?Patient seen and examined at bedside.  Continues to have intermittent lower abdominal pain.  Denies worsening shortness of breath, fever, vomiting. ?Objective: ?Vitals:  ? 03/06/22 2335 03/07/22 1540 03/07/22 0507 03/07/22 0641  ?BP: (!) 164/111 (!) 155/97 (!) 180/110 (!) 159/92  ?Pulse: 76 66 70 72  ?Resp: '19 18 17   '$ ?Temp:   97.6 ?F (36.4  ?C)   ?TempSrc:   Oral   ?SpO2:   96%   ?Weight:   74.1 kg   ?Height:      ? ? ?Intake/Output Summary (Last 24 hours) at 03/07/2022 0725 ?Last data filed at 03/07/2022 0607 ?Gross per 24 hour  ?Intake 1698.38 ml  ?Output --  ?Net 1698.38 ml  ? ? ?Filed Weights  ? 03/05/22 1223 03/06/22 0606 03/07/22 0507  ?Weight: 72.6 kg 67.3 kg 74.1 kg  ? ? ?Examination: ? ?General: On room air.  No distress ?ENT/neck: No thyromegaly.  JVD is not elevated  ?respiratory: Decreased breath sounds at bases bilaterally with some crackles; no wheezing  ?CVS: S1-S2 heard, rate controlled currently ?Abdominal: Soft, lower abdominal tenderness present, slightly distended; no organomegaly, normal bowel sounds are heard ?Extremities: Trace lower extremity edema; no cyanosis  ?CNS: Awake and alert.  No focal neurologic deficit.  Moves extremities ?Lymph: No obvious lymphadenopathy ?Skin: No obvious ecchymosis/lesions  ?psych: Affect, judgment and mood are normal  ?musculoskeletal: No obvious joint swelling/deformity ? ? ? ? ?Data Reviewed: I have personally reviewed following labs and imaging studies ? ?CBC: ?Recent Labs  ?Lab 03/05/22 ?1226 03/06/22 ?0867 03/07/22 ?0304  ?WBC 12.3* 6.9 6.3  ?NEUTROABS  --   --  4.5  ?HGB 16.8 14.3 14.3  ?HCT 48.5 41.8 41.6  ?MCV 95.7 98.1 97.0  ?PLT 233 170 169  ? ? ?Basic Metabolic Panel: ?Recent Labs  ?Lab 03/05/22 ?1226 03/07/22 ?0304  ?NA 139 135  ?K 4.1 3.3*  ?CL 102 102  ?CO2 27 26  ?GLUCOSE 110* 92  ?BUN 12 7  ?CREATININE 0.73 0.64  ?CALCIUM 9.6 8.6*  ?MG  --  2.0  ? ? ?  GFR: ?Estimated Creatinine Clearance: 104.5 mL/min (by C-G formula based on SCr of 0.64 mg/dL). ?Liver Function Tests: ?Recent Labs  ?Lab 03/05/22 ?1226 03/07/22 ?0304  ?AST 51* 26  ?ALT 53* 35  ?ALKPHOS 74 53  ?BILITOT 0.8 0.7  ?PROT 8.1 6.6  ?ALBUMIN 4.2 3.3*  ? ? ?Recent Labs  ?Lab 03/05/22 ?1226  ?LIPASE 40  ? ? ?No results for input(s): AMMONIA in the last 168 hours. ?Coagulation Profile: ?No results for input(s): INR, PROTIME in  the last 168 hours. ?Cardiac Enzymes: ?No results for input(s): CKTOTAL, CKMB, CKMBINDEX, TROPONINI in the last 168 hours. ?BNP (last 3 results) ?No results for input(s): PROBNP in the last 8760 hours. ?HbA1C: ?No results for input(s): HGBA1C in the last 72 hours. ?CBG: ?No results for input(s): GLUCAP in the last 168 hours. ?Lipid Profile: ?No results for input(s): CHOL, HDL, LDLCALC, TRIG, CHOLHDL, LDLDIRECT in the last 72 hours. ?Thyroid Function Tests: ?No results for input(s): TSH, T4TOTAL, FREET4, T3FREE, THYROIDAB in the last 72 hours. ?Anemia Panel: ?No results for input(s): VITAMINB12, FOLATE, FERRITIN, TIBC, IRON, RETICCTPCT in the last 72 hours. ?Sepsis Labs: ?No results for input(s): PROCALCITON, LATICACIDVEN in the last 168 hours. ? ?No results found for this or any previous visit (from the past 240 hour(s)).  ? ? ? ? ? ?Radiology Studies: ?CT ABDOMEN PELVIS W CONTRAST ? ?Result Date: 03/05/2022 ?CLINICAL DATA:  Left lower quadrant abdominal pain EXAM: CT ABDOMEN AND PELVIS WITH CONTRAST TECHNIQUE: Multidetector CT imaging of the abdomen and pelvis was performed using the standard protocol following bolus administration of intravenous contrast. RADIATION DOSE REDUCTION: This exam was performed according to the departmental dose-optimization program which includes automated exposure control, adjustment of the mA and/or kV according to patient size and/or use of iterative reconstruction technique. CONTRAST:  135m OMNIPAQUE IOHEXOL 300 MG/ML  SOLN COMPARISON:  10/20/2013 FINDINGS: Lower chest: Included lung bases are clear.  Heart size is normal. Hepatobiliary: No focal liver abnormality is seen. No gallstones, gallbladder wall thickening, or biliary dilatation. Pancreas: Stable mild dilation of the main pancreatic duct. No focal parenchymal abnormality. No peripancreatic inflammatory changes. Spleen: Normal in size without focal abnormality. Adrenals/Urinary Tract: Unremarkable adrenal glands. Punctate 2  mm nonobstructing stone at the upper pole of the left kidney. Kidneys enhance symmetrically. No solid lesion or hydronephrosis. Urinary bladder is within normal limits. Stomach/Bowel: Acute diverticulitis of the mid sigmoid colon containing multiple prominent diverticula. Relatively short segment of bowel wall thickening with surrounding pericolonic fat stranding and trace fluid. There are a few small foci of extraluminal air adjacent to the inflamed segment compatible with micro perforation (series 2, image 57). Multiple fluid-filled, mildly dilated loops of small bowel throughout the abdomen without abrupt transition point, likely a reactive enteritis or ileus. Normal appendix in the right lower quadrant. Small hiatal hernia. Stomach otherwise unremarkable. Vascular/Lymphatic: Scattered aortoiliac atherosclerotic calcifications without aneurysm. No abdominopelvic lymphadenopathy. Reproductive: Prostate is unremarkable. Other: No significant ascites or organized fluid collection. No abdominal wall abnormality. Musculoskeletal: No acute or significant osseous findings. IMPRESSION: 1. Acute sigmoid diverticulitis with microperforation. 2. Multiple fluid-filled, mildly dilated loops of small bowel throughout the abdomen without abrupt transition point, likely a reactive enteritis or ileus. 3. Nonobstructing left nephrolithiasis. Aortic Atherosclerosis (ICD10-I70.0). These results were called by telephone at the time of interpretation on 03/05/2022 at 2:13 pm to provider Tegler, who verbally acknowledged these results. Electronically Signed   By: NDavina PokeD.O.   On: 03/05/2022 14:14   ? ? ? ? ? ?  Scheduled Meds: ? acetaminophen  1,000 mg Oral Q6H  ? enoxaparin (LOVENOX) injection  40 mg Subcutaneous Daily  ? potassium chloride  40 mEq Oral Once  ? ?Continuous Infusions: ? sodium chloride 10 mL/hr at 03/05/22 1438  ? sodium chloride 75 mL/hr at 03/07/22 0332  ? piperacillin-tazobactam (ZOSYN)  IV 3.375 g (03/07/22  8016)  ? ? ? ? ? ? ? ? ?Aline August, MD ?[facility] ?03/07/2022, 7:25 AM  ? ?

## 2022-03-08 DIAGNOSIS — K5720 Diverticulitis of large intestine with perforation and abscess without bleeding: Secondary | ICD-10-CM | POA: Diagnosis not present

## 2022-03-08 DIAGNOSIS — I10 Essential (primary) hypertension: Secondary | ICD-10-CM

## 2022-03-08 DIAGNOSIS — F1090 Alcohol use, unspecified, uncomplicated: Secondary | ICD-10-CM | POA: Diagnosis not present

## 2022-03-08 DIAGNOSIS — Z720 Tobacco use: Secondary | ICD-10-CM | POA: Diagnosis not present

## 2022-03-08 LAB — C-REACTIVE PROTEIN: CRP: 1.5 mg/dL — ABNORMAL HIGH (ref <1.0)

## 2022-03-08 LAB — MAGNESIUM: Magnesium: 2.2 mg/dL (ref 1.7–2.4)

## 2022-03-08 MED ORDER — AMLODIPINE BESYLATE 10 MG PO TABS: 10.0000 mg | ORAL_TABLET | Freq: Every day | ORAL | 0 refills | Status: DC

## 2022-03-08 MED ORDER — AMOXICILLIN-POT CLAVULANATE 875-125 MG PO TABS
1.0000 | ORAL_TABLET | Freq: Two times a day (BID) | ORAL | Status: DC
  Administered 2022-03-08: 1 via ORAL
  Filled 2022-03-08: qty 1

## 2022-03-08 MED ORDER — TRAMADOL HCL 50 MG PO TABS: 50.0000 mg | ORAL_TABLET | Freq: Four times a day (QID) | ORAL | 0 refills | Status: DC | PRN

## 2022-03-08 MED ORDER — AMOXICILLIN-POT CLAVULANATE 875-125 MG PO TABS: 1.0000 | ORAL_TABLET | Freq: Two times a day (BID) | ORAL | 0 refills | Status: AC

## 2022-03-08 MED ORDER — AMLODIPINE BESYLATE 10 MG PO TABS
10.0000 mg | ORAL_TABLET | Freq: Every day | ORAL | Status: DC
  Administered 2022-03-08: 10 mg via ORAL
  Filled 2022-03-08: qty 1

## 2022-03-08 MED ORDER — HYDRALAZINE HCL 25 MG PO TABS
25.0000 mg | ORAL_TABLET | Freq: Once | ORAL | Status: AC
  Administered 2022-03-08: 25 mg via ORAL
  Filled 2022-03-08: qty 1

## 2022-03-08 NOTE — TOC Transition Note (Signed)
Transition of Care (TOC) - CM/SW Discharge Note ? ? ?Patient Details  ?Name: Christian Peterson ?MRN: [id number] ?Date of Birth: 28-Apr-1971 ? ?Transition of Care (TOC) CM/SW Contact:  ?Ross Ludwig, LCSW ?Phone Number: ?03/08/2022, 10:59 AM ? ? ?Clinical Narrative:    ? ?Patient was screened no TOC needs, patient will be discharging back home.  CSW signing off, please reconsult for any social wok needs. ? ?  ?Barriers to Discharge: No Barriers Identified ? ? ?Patient Goals and CMS Choice ?  ?  ?  ? ?Discharge Placement ?  ?           ?  ?  ?  ?  ? ?Discharge Plan and Services ?  ?  ?           ?  ?  ?  ?  ?  ?  ?  ?  ?  ?  ? ?Social Determinants of Health (SDOH) Interventions ?  ? ? ?Readmission Risk Interventions ?   ? View : No data to display.  ?  ?  ?  ? ? ? ? ? ?

## 2022-03-08 NOTE — Progress Notes (Signed)
Pt BP 158/115. Pt refused antihypertensive states "I have taken a lot in 2 days as other people would take in a week. I don't want it anymore".  ?

## 2022-03-08 NOTE — Discharge Summary (Signed)
Physician Discharge Summary  ?Christian Peterson:[id number] DOB: 10/17/1971 DOA: 03/05/2022 ? ?PCP: Mackie Pai, PA-C ? ?Admit date: 03/05/2022 ?Discharge date: 03/08/2022 ? ?Admitted From: Home ?Disposition: Home ? ?Recommendations for Outpatient Follow-up:  ?Follow up with PCP in 1 week with repeat CBC/BMP ?Outpatient follow-up with general surgery ?Follow up in ED if symptoms worsen or new appear ? ? ?Home Health: No ?Equipment/Devices: None ? ?Discharge Condition: Stable ?CODE STATUS: Full ?Diet recommendation: Heart healthy ? ?Brief/Interim Summary: ?51 year old male with history of GERD and diverticulosis presented from outside ED for acute diverticulitis with microperforation.  In the ED, patient was hypertensive, tachycardic with WBC of 12.3, AST 51, ALT 53. CT of the abdomen pelvis showed diverticulitis with microperforation and likely enteritis/ileitis of the small bowel.  General surgery was consulted.  He was started on IV fluids and antibiotics.  During the hospitalization, his condition has gradually improved.  His diet has been advanced.  He is currently hemodynamically stable.  Diet has been advanced to soft diet today by general surgery and general surgery cleared the patient for discharge on oral antibiotics with outpatient follow-up with general surgery.  Discharge patient home today. ? ?Discharge Diagnoses:  ? ?Acute sigmoid diverticulitis with microperforation ?-Treated with IV fluids and Zosyn.   ?-During the hospitalization, his condition has gradually improved.  His diet has been advanced.  He is currently hemodynamically stable.  Diet has been advanced to soft diet today by general surgery and general surgery cleared the patient for discharge on oral antibiotics/Augmentin for another 10 days with outpatient follow-up with general surgery.  Discharge patient home today. ? ?  ?Leukocytosis ?-Resolved ?  ?Mild elevated LFTs ?-Possibly from alcohol use.  Resolved.   ?  ?Hypertension ?-No formal  diagnosis of hypertension.  Blood pressure remains elevated.  Increase amlodipine to 10 mg daily.  Discharge home on oral amlodipine 10 mg daily.  Outpatient follow-up with PCP. ?  ?Hypokalemia ?-Improved.  No labs today ? ?Hyponatremia ?-Mild.  No labs today. ?  ?Alcohol use ?-Reported to drink 3-4 beers nightly.  Currently no signs of withdrawal.  Outpatient follow-up. ?  ?Tobacco abuse ?-Apparently smokes 1 pack daily.  Patient apparently declined nicotine patch.  Counseled regarding cessation ?  ? ?Discharge Instructions ? ?Discharge Instructions   ? ? Diet - low sodium heart healthy   Complete by: As directed ?  ? Increase activity slowly   Complete by: As directed ?  ? ?  ? ?Allergies as of 03/08/2022   ?No Known Allergies ?  ? ?  ?Medication List  ?  ? ?TAKE these medications   ? ?amLODipine 10 MG tablet ?Commonly known as: NORVASC ?Take 1 tablet (10 mg total) by mouth daily. ?  ?amoxicillin-clavulanate 875-125 MG tablet ?Commonly known as: AUGMENTIN ?Take 1 tablet by mouth every 12 (twelve) hours for 10 days. ?  ?famotidine 20 MG tablet ?Commonly known as: PEPCID ?Take 1 tablet (20 mg total) by mouth 2 (two) times daily. ?  ?ibuprofen 200 MG tablet ?Commonly known as: ADVIL ?Take 400 mg by mouth every 6 (six) hours as needed for mild pain or moderate pain. ?  ?ondansetron 4 MG tablet ?Commonly known as: Zofran ?Take 1 tablet (4 mg total) by mouth every 8 (eight) hours as needed for nausea or vomiting. ?  ?traMADol 50 MG tablet ?Commonly known as: ULTRAM ?Take 1 tablet (50 mg total) by mouth every 6 (six) hours as needed for moderate pain. ?  ? ?  ? ?  Follow-up Information   ? ? Saguier, Percell Miller, PA-C. Schedule an appointment as soon as possible for a visit in 1 week(s).   ?Specialties: Internal Medicine, Family Medicine ?Contact information: ?[address] ?[address] ?[place] [address] ?[phone] ? ? ?  ?  ? ? Autumn Messing III, MD. Schedule an appointment as soon as possible for a visit in 1 week(s).    ?Specialty: General Surgery ?Contact information: ?[address] ?[address] ?[place] [address] ?[phone] ? ? ?  ?  ? ?  ?  ? ?  ? ?No Known Allergies ? ?Consultations: ?General surgery ? ? ?Procedures/Studies: ?CT ABDOMEN PELVIS W CONTRAST ? ?Result Date: 03/05/2022 ?CLINICAL DATA:  Left lower quadrant abdominal pain EXAM: CT ABDOMEN AND PELVIS WITH CONTRAST TECHNIQUE: Multidetector CT imaging of the abdomen and pelvis was performed using the standard protocol following bolus administration of intravenous contrast. RADIATION DOSE REDUCTION: This exam was performed according to the departmental dose-optimization program which includes automated exposure control, adjustment of the mA and/or kV according to patient size and/or use of iterative reconstruction technique. CONTRAST:  128m OMNIPAQUE IOHEXOL 300 MG/ML  SOLN COMPARISON:  10/20/2013 FINDINGS: Lower chest: Included lung bases are clear.  Heart size is normal. Hepatobiliary: No focal liver abnormality is seen. No gallstones, gallbladder wall thickening, or biliary dilatation. Pancreas: Stable mild dilation of the main pancreatic duct. No focal parenchymal abnormality. No peripancreatic inflammatory changes. Spleen: Normal in size without focal abnormality. Adrenals/Urinary Tract: Unremarkable adrenal glands. Punctate 2 mm nonobstructing stone at the upper pole of the left kidney. Kidneys enhance symmetrically. No solid lesion or hydronephrosis. Urinary bladder is within normal limits. Stomach/Bowel: Acute diverticulitis of the mid sigmoid colon containing multiple prominent diverticula. Relatively short segment of bowel wall thickening with surrounding pericolonic fat stranding and trace fluid. There are a few small foci of extraluminal air adjacent to the inflamed segment compatible with micro perforation (series 2, image 57). Multiple fluid-filled, mildly dilated loops of small bowel throughout the abdomen without abrupt transition point, likely a  reactive enteritis or ileus. Normal appendix in the right lower quadrant. Small hiatal hernia. Stomach otherwise unremarkable. Vascular/Lymphatic: Scattered aortoiliac atherosclerotic calcifications without aneurysm. No abdominopelvic lymphadenopathy. Reproductive: Prostate is unremarkable. Other: No significant ascites or organized fluid collection. No abdominal wall abnormality. Musculoskeletal: No acute or significant osseous findings. IMPRESSION: 1. Acute sigmoid diverticulitis with microperforation. 2. Multiple fluid-filled, mildly dilated loops of small bowel throughout the abdomen without abrupt transition point, likely a reactive enteritis or ileus. 3. Nonobstructing left nephrolithiasis. Aortic Atherosclerosis (ICD10-I70.0). These results were called by telephone at the time of interpretation on 03/05/2022 at 2:13 pm to provider Tegler, who verbally acknowledged these results. Electronically Signed   By: NDavina PokeD.O.   On: 03/05/2022 14:14   ? ? ? ?Subjective: ?Patient seen and examined at bedside.  Feels much better and wants to go home today.  Tolerating full liquid diet.  No fever, worsening abdominal pain, vomiting reported.  Having bowel movements. ? ?Discharge Exam: ?Vitals:  ? 03/08/22 0116 03/08/22 0500  ?BP: (!) 151/89 (!) 158/115  ?Pulse: 76 82  ?Resp:  18  ?Temp:  98.4 ?F (36.9 ?C)  ?SpO2:  98%  ? ? ?General: Pt is alert, awake, not in acute distress; currently on room air ?Cardiovascular: rate controlled, S1/S2 + ?Respiratory: bilateral decreased breath sounds at bases ?Abdominal: Soft, only very mildly tender in the lower quadrant, ND, bowel sounds + ?Extremities: Trace lower extremity edema present; no cyanosis ? ? ? ?  The results of significant diagnostics from this hospitalization (including imaging, microbiology, ancillary and laboratory) are listed below for reference.   ? ? ?Microbiology: ?No results found for this or any previous visit (from the past 240 hour(s)).  ? ?Labs: ?BNP  (last 3 results) ?No results for input(s): BNP in the last 8760 hours. ?Basic Metabolic Panel: ?Recent Labs  ?Lab 03/05/22 ?1226 03/07/22 ?0304 03/07/22 ?1027 03/08/22 ?0300  ?NA 139 135 134*  --   ?K 4.1

## 2022-03-08 NOTE — Progress Notes (Signed)
? ?  Subjective/Chief Complaint: ?No complaints. Concerned about high blood pressure ? ? ?Objective: ?Vital signs in last 24 hours: ?Temp:  [98 ?F (36.7 ?C)-99.3 ?F (37.4 ?C)] 98.4 ?F (36.9 ?C) (04/23 0500) ?Pulse Rate:  [75-108] 82 (04/23 0500) ?Resp:  [18] 18 (04/23 0500) ?BP: (124-174)/(70-115) 158/115 (04/23 0500) ?SpO2:  [90 %-98 %] 98 % (04/23 0500) ?Weight:  [71.4 kg] 71.4 kg (04/23 0500) ?Last BM Date : 03/07/22 ? ?Intake/Output from previous day: ?04/22 0701 - 04/23 0700 ?In: 1286.2 [P.O.:240; I.V.:896.3; IV Piggyback:149.9] ?Out: -  ?Intake/Output this shift: ?No intake/output data recorded. ? ?General appearance: alert and cooperative ?Resp: clear to auscultation bilaterally ?Cardio: regular rate and rhythm ?GI: soft, nontender ? ?Lab Results:  ?Recent Labs  ?  03/06/22 ?3532 03/07/22 ?0304  ?WBC 6.9 6.3  ?HGB 14.3 14.3  ?HCT 41.8 41.6  ?PLT 170 169  ? ?BMET ?Recent Labs  ?  03/07/22 ?0304 03/07/22 ?1027  ?NA 135 134*  ?K 3.3* 3.7  ?CL 102 103  ?CO2 26 25  ?GLUCOSE 92 141*  ?BUN 7 6  ?CREATININE 0.64 0.64  ?CALCIUM 8.6* 8.7*  ? ?PT/INR ?No results for input(s): LABPROT, INR in the last 72 hours. ?ABG ?No results for input(s): PHART, HCO3 in the last 72 hours. ? ?Invalid input(s): PCO2, PO2 ? ?Studies/Results: ?No results found. ? ?Anti-infectives: ?Anti-infectives (From admission, onward)  ? ? Start     Dose/Rate Route Frequency Ordered Stop  ? 03/08/22 1000  amoxicillin-clavulanate (AUGMENTIN) 875-125 MG per tablet 1 tablet       ? 1 tablet Oral Every 12 hours 03/08/22 0815    ? 03/05/22 2200  piperacillin-tazobactam (ZOSYN) IVPB 3.375 g  Status:  Discontinued       ? 3.375 g ?12.5 mL/hr over 240 Minutes Intravenous Every 8 hours 03/05/22 1522 03/08/22 0815  ? 03/05/22 1530  piperacillin-tazobactam (ZOSYN) IVPB 3.375 g  Status:  Discontinued       ? 3.375 g ?100 mL/hr over 30 Minutes Intravenous Every 8 hours 03/05/22 1520 03/05/22 1522  ? 03/05/22 1430  piperacillin-tazobactam (ZOSYN) IVPB 3.375 g        ? 3.375 g ?100 mL/hr over 30 Minutes Intravenous  Once 03/05/22 1418 03/05/22 1605  ? ?  ? ? ?Assessment/Plan: ?s/p * No surgery found * ?Advance diet. Start soft food ?Switch to oral abx today ?[place] for d/c from surgical standpoint. He will need to follow up with one of our colorectal specialists in next week or two ?HTN per medicine ? LOS: 3 days  ? ? ?Christian Peterson ?03/08/2022 ? ?

## 2022-03-08 NOTE — Plan of Care (Signed)
  Problem: Education: Goal: Knowledge of General Education information will improve Description: Including pain rating scale, medication(s)/side effects and non-pharmacologic comfort measures Outcome: Progressing   Problem: Health Behavior/Discharge Planning: Goal: Ability to manage health-related needs will improve Outcome: Progressing   Problem: Pain Managment: Goal: General experience of comfort will improve Outcome: Progressing   

## 2022-03-08 NOTE — Progress Notes (Signed)
The patient is alert and oriented and has been seen by his physician. The orders for discharge were written. IV has been removed. Patient tolerated soft diet well and ate 100% of breakfast. Patient had a type 4 BM this AM. Patient's diastolic number for BP was elevated. Made Dr. Starla Link, MD aware. Received verbal order to give one dose of Hydralazine prior to patient's discharge. Hydralazine given. Patient was instructed to follow-up with primary MD about elevated BP. Went over discharge instructions with patient. RN is walking patient down to his ride. Patient has all belongings with him.   ?

## 2022-03-09 ENCOUNTER — Telehealth: Payer: Self-pay

## 2022-03-09 NOTE — Telephone Encounter (Addendum)
Transition Care Management Unsuccessful Follow-up Telephone Call ? ?Date of discharge and from where:  [facility] 03-08-22 Dx: acute sigmoid diverticulitis with microperforation ? ?Attempts:  1st Attempt ? ?Reason for unsuccessful TCM follow-up call:  Left voice message ? ?Transition Care Management Follow-up Telephone Call ?Date of discharge and from where: [facility] 03-08-22 Dx: acute sigmoid diverticulitis with microperforation ?How have you been since you were released from the hospital? Doing ok  ?Any questions or concerns? No ? ?Items Reviewed: ?Did the pt receive and understand the discharge instructions provided? Yes  ?Medications obtained and verified? Yes  ?Other? No  ?Any new allergies since your discharge? No  ?Dietary orders reviewed? Yes ?Do you have support at home? Yes  ? ?Home Care and Equipment/Supplies: ?Were home health services ordered? no ?If so, what is the name of the agency? na ?Has the agency set up a time to come to the patient's home? not applicable ?Were any new equipment or medical supplies ordered?  No ?What is the name of the medical supply agency? na ?Were you able to get the supplies/equipment? not applicable ?Do you have any questions related to the use of the equipment or supplies? na ? ?Functional Questionnaire: (I = Independent and D = Dependent) ?ADLs: I ? ?Bathing/Dressing- I ? ?Meal Prep- I ? ?Eating- I ? ?Maintaining continence- I ? ?Transferring/Ambulation- I ? ?Managing Meds- I ? ?Follow up appointments reviewed: ? ?PCP Hospital f/u appt confirmed? Yes  Scheduled to see Dr Harvie Heck on 03-11-22 @ 140pm. ?[facility] f/u appt confirmed? Yes  Scheduled to see Dr Marcello Moores on 03-24-22 @ 1030am. ?Are transportation arrangements needed? No  ?If their condition worsens, is the pt aware to call PCP or go to the Emergency Dept.? Yes ?Was the patient provided with contact information for the PCP's office or ED? Yes ?Was to pt encouraged to call back with questions or  concerns? Yes  ?  ?

## 2022-03-11 ENCOUNTER — Encounter: Payer: Self-pay | Admitting: Medical

## 2022-03-11 ENCOUNTER — Ambulatory Visit (INDEPENDENT_AMBULATORY_CARE_PROVIDER_SITE_OTHER): Payer: Managed Care, Other (non HMO) | Admitting: Medical

## 2022-03-11 VITALS — BP 130/80 | HR 100 | Resp 18 | Ht 68.0 in | Wt 159.4 lb

## 2022-03-11 DIAGNOSIS — K5720 Diverticulitis of large intestine with perforation and abscess without bleeding: Secondary | ICD-10-CM

## 2022-03-11 DIAGNOSIS — I10 Essential (primary) hypertension: Secondary | ICD-10-CM | POA: Diagnosis not present

## 2022-03-11 NOTE — Patient Instructions (Addendum)
Diverticulitis with perforation. Clinically improved now after admission and antibiotics. Will repeat cbc today. Also will go ahead and repeat cbc. Keep you appointment with surgeon to evaluate the area. Pt has appt on Mar 24, 2022. ? ?Continue augmentin until finished. If pain returns as surgeon appt pending let me know. ? ?For htn continue amlodipine. Bp 130/80. You have some higher readings. Check bp daily. If bp level over 140/90 then do 2 additional readings. Send me bp update in one week. You may need additional medication. ? ?For smoking cessation consider wellbturin. If you want to use let me know and will prescribe. ? ?Follow up 1 month or sooner if needed. ? ?Diverticulitis ? ?Diverticulitis is infection or inflammation of small pouches (diverticula) in the colon that form due to a condition called diverticulosis. Diverticula can trap stool (feces) and bacteria, causing infection and inflammation. ?Diverticulitis may cause severe stomach pain and diarrhea. It may lead to tissue damage in the colon that causes bleeding or blockage. The diverticula may also burst (rupture) and cause infected stool to enter other areas of the abdomen. ?What are the causes? ?This condition is caused by stool becoming trapped in the diverticula, which allows bacteria to grow in the diverticula. This leads to inflammation and infection. ?What increases the risk? ?You are more likely to develop this condition if you have diverticulosis. The risk increases if you: ?Are overweight or obese. ?Do not get enough exercise. ?Drink alcohol. ?Use tobacco products. ?Eat a diet that has a lot of red meat such as beef, pork, or lamb. ?Eat a diet that does not include enough fiber. High-fiber foods include fruits, vegetables, beans, nuts, and whole grains. ?Are over 25 years of age. ?What are the signs or symptoms? ?Symptoms of this condition may include: ?Pain and tenderness in the abdomen. The pain is normally located on the left side of the  abdomen, but it may occur in other areas. ?Fever and chills. ?Nausea. ?Vomiting. ?Cramping. ?Bloating. ?Changes in bowel routines. ?Blood in your stool. ?How is this diagnosed? ?This condition is diagnosed based on: ?Your medical history. ?A physical exam. ?Tests to make sure there is nothing else causing your condition. These tests may include: ?Blood tests. ?Urine tests. ?CT scan of the abdomen. ?How is this treated? ?Most cases of this condition are mild and can be treated at home. Treatment may include: ?Taking over-the-counter pain medicines. ?Following a clear liquid diet. ?Taking antibiotic medicines by mouth. ?Resting. ?More severe cases may need to be treated at a hospital. Treatment may include: ?Not eating or drinking. ?Taking prescription pain medicine. ?Receiving antibiotic medicines through an IV. ?Receiving fluids and nutrition through an IV. ?Surgery. ?When your condition is under control, your health care provider may recommend that you have a colonoscopy. This is an exam to look at the entire large intestine. During the exam, a lubricated, bendable tube is inserted into the anus and then passed into the rectum, colon, and other parts of the large intestine. A colonoscopy can show how severe your diverticula are and whether something else may be causing your symptoms. ?Follow these instructions at home: ?Medicines ?Take over-the-counter and prescription medicines only as told by your health care provider. These include fiber supplements, probiotics, and stool softeners. ?If you were prescribed an antibiotic medicine, take it as told by your health care provider. Do not stop taking the antibiotic even if you start to feel better. ?Ask your health care provider if the medicine prescribed to you requires you to avoid  driving or using machinery. ?Eating and drinking ? ?Follow a full liquid diet or another diet as directed by your health care provider. ?After your symptoms improve, your health care  provider may tell you to change your diet. He or she may recommend that you eat a diet that contains at least 25 grams (25 g) of fiber daily. Fiber makes it easier to pass stool. Healthy sources of fiber include: ?Berries. One cup contains 4-8 grams of fiber. ?Beans or lentils. One-half cup contains 5-8 grams of fiber. ?Green vegetables. One cup contains 4 grams of fiber. ?Avoid eating red meat. ?General instructions ?Do not use any products that contain nicotine or tobacco, such as cigarettes, e-cigarettes, and chewing tobacco. If you need help quitting, ask your health care provider. ?Exercise for at least 30 minutes, 3 times each week. You should exercise hard enough to raise your heart rate and break a sweat. ?Keep all follow-up visits as told by your health care provider. This is important. You may need to have a colonoscopy. ?Contact a health care provider if: ?Your pain does not improve. ?Your bowel movements do not return to normal. ?Get help right away if: ?Your pain gets worse. ?Your symptoms do not get better with treatment. ?Your symptoms suddenly get worse. ?You have a fever. ?You vomit more than one time. ?You have stools that are bloody, black, or tarry. ?Summary ?Diverticulitis is infection or inflammation of small pouches (diverticula) in the colon that form due to a condition called diverticulosis. Diverticula can trap stool (feces) and bacteria, causing infection and inflammation. ?You are at higher risk for this condition if you have diverticulosis and you eat a diet that does not include enough fiber. ?Most cases of this condition are mild and can be treated at home. More severe cases may need to be treated at a hospital. ?When your condition is under control, your health care provider may recommend that you have an exam called a colonoscopy. This exam can show how severe your diverticula are and whether something else may be causing your symptoms. ?Keep all follow-up visits as told by your  health care provider. This is important. ?This information is not intended to replace advice given to you by your health care provider. Make sure you discuss any questions you have with your health care provider. ?Document Revised: 08/14/2019 Document Reviewed: 08/14/2019 ?Elsevier Patient Education ? [place]. ? ? ? ? ?

## 2022-03-11 NOTE — Progress Notes (Signed)
? ?Subjective:  ? ? Patient ID: Christian Peterson, male    DOB: [birth year], 51 y.o.   MRN: [id number] ? ?HPI ? ?Pt in for follow up. ? ?He had was seen for abdomen pain when I saw pt. I sent home to ED and he was admitted. ? ?Admit date: 03/05/2022 ?Discharge date: 03/08/2022 ?  ?Admitted From: Home ?Disposition: Home ?  ?Recommendations for Outpatient Follow-up:  ?Follow up with PCP in 1 week with repeat CBC/BMP ?Outpatient follow-up with general surgery ?Follow up in ED if symptoms worsen or new appear ?  ?  ?Home Health: No ?Equipment/Devices: None ?  ?Discharge Condition: Stable ?CODE STATUS: Full ?Diet recommendation: Heart healthy ?  ?Brief/Interim Summary: ?51 year old male with history of GERD and diverticulosis presented from outside ED for acute diverticulitis with microperforation.  In the ED, patient was hypertensive, tachycardic with WBC of 12.3, AST 51, ALT 53. CT of the abdomen pelvis showed diverticulitis with microperforation and likely enteritis/ileitis of the small bowel.  General surgery was consulted.  He was started on IV fluids and antibiotics.  During the hospitalization, his condition has gradually improved.  His diet has been advanced.  He is currently hemodynamically stable.  Diet has been advanced to soft diet today by general surgery and general surgery cleared the patient for discharge on oral antibiotics with outpatient follow-up with general surgery.  Discharge patient home today. ? ?Discharge Diagnoses:  ?  ?Acute sigmoid diverticulitis with microperforation ?-Treated with IV fluids and Zosyn.   ?-During the hospitalization, his condition has gradually improved.  His diet has been advanced.  He is currently hemodynamically stable.  Diet has been advanced to soft diet today by general surgery and general surgery cleared the patient for discharge on oral antibiotics/Augmentin for another 10 days with outpatient follow-up with general surgery.  Discharge patient home today. ?  ?   ?Leukocytosis ?-Resolved ?  ?Mild elevated LFTs ?-Possibly from alcohol use.  Resolved.   ?  ?Hypertension ?-No formal diagnosis of hypertension.  Blood pressure remains elevated.  Increase amlodipine to 10 mg daily.  Discharge home on oral amlodipine 10 mg daily.  Outpatient follow-up with PCP. ?  ?Hypokalemia ?-Improved.  No labs today ? ?Hyponatremia ?-Mild.  No labs today. ?  ?Alcohol use ?-Reported to drink 3-4 beers nightly.  Currently no signs of withdrawal.  Outpatient follow-up. ?  ?Tobacco abuse ?-Apparently smokes 1 pack daily.  Patient apparently declined nicotine patch.  Counseled regarding cessation ? ? ?Pt states he feels a lot better. He is still on augmentin antiobitic. No fever no chills, no sweats or abdomen pain. ? ? ?In the hospital his bp levels were quite high up to 295 systolic range. They gave patient amlodipine.  ? ? ? ?Review of Systems  ?Constitutional:  Negative for chills, fatigue and fever.  ?Respiratory:  Negative for cough, chest tightness, shortness of breath and wheezing.   ?Cardiovascular:  Negative for chest pain and palpitations.  ?Gastrointestinal:  Negative for abdominal pain.  ?Musculoskeletal:  Negative for back pain, myalgias and neck stiffness.  ?Skin:  Negative for rash.  ?Neurological:  Negative for dizziness, light-headedness and headaches.  ?Hematological:  Negative for adenopathy. Does not bruise/bleed easily.  ?Psychiatric/Behavioral:  Negative for behavioral problems, confusion and sleep disturbance. The patient is not nervous/anxious.   ? ?Past Medical History:  ?Diagnosis Date  ? Allergy   ? Cellulitis   ? R foot  ? GERD (gastroesophageal reflux disease)   ? occ with spicy foods   ? ?  ?  Social History  ? ?Socioeconomic History  ? Marital status: Married  ?  Spouse name: Not on file  ? Number of children: Not on file  ? Years of education: Not on file  ? Highest education level: Not on file  ?Occupational History  ? Not on file  ?Tobacco Use  ? Smoking status:  Every Day  ?  Packs/day: 1.00  ?  Types: Cigarettes  ? Smokeless tobacco: Never  ?Substance and Sexual Activity  ? Alcohol use: Yes  ?  Comment: Drinks 5 out of 7 days.  Mostly beer.  Some liquor.  ? Drug use: Yes  ?  Types: Marijuana  ?  Comment: LAST SMOKED 08/11/20  ? Sexual activity: Yes  ?Other Topics Concern  ? Not on file  ?Social History Narrative  ? Not on file  ? ?Social Determinants of Health  ? ?Financial Resource Strain: Not on file  ?Food Insecurity: Not on file  ?Transportation Needs: Not on file  ?Physical Activity: Not on file  ?Stress: Not on file  ?Social Connections: Not on file  ?Intimate Partner Violence: Not on file  ? ? ?Past Surgical History:  ?Procedure Laterality Date  ? ANKLE FRACTURE SURGERY    ? plate   ? WISDOM TOOTH EXTRACTION    ? ? ?Family History  ?Adopted: Yes  ? ? ?No Known Allergies ? ?Current Outpatient Medications on File Prior to Visit  ?Medication Sig Dispense Refill  ? amLODipine (NORVASC) 10 MG tablet Take 1 tablet (10 mg total) by mouth daily. 30 tablet 0  ? amoxicillin-clavulanate (AUGMENTIN) 875-125 MG tablet Take 1 tablet by mouth every 12 (twelve) hours for 10 days. 20 tablet 0  ? famotidine (PEPCID) 20 MG tablet Take 1 tablet (20 mg total) by mouth 2 (two) times daily. 60 tablet 0  ? ibuprofen (ADVIL) 200 MG tablet Take 400 mg by mouth every 6 (six) hours as needed for mild pain or moderate pain.    ? ondansetron (ZOFRAN) 4 MG tablet Take 1 tablet (4 mg total) by mouth every 8 (eight) hours as needed for nausea or vomiting. 20 tablet 0  ? traMADol (ULTRAM) 50 MG tablet Take 1 tablet (50 mg total) by mouth every 6 (six) hours as needed for moderate pain. 14 tablet 0  ? ?No current facility-administered medications on file prior to visit.  ? ? ?BP (!) 156/100   Pulse 100   Resp 18   Ht '5\' 8"'$  (1.727 m)   Wt 159 lb 6.4 oz (72.3 kg)   SpO2 100%   BMI 24.24 kg/m?  ?  ? ?   ?Objective:  ? Physical Exam ? ?General ?Mental Status- Alert. General Appearance- Not in acute  distress.  ? ?Skin ?General: Color- Normal Color. Moisture- Normal Moisture. ? ?Neck ?Carotid Arteries- Normal color. Moisture- Normal Moisture. No carotid bruits. No JVD. ? ?Chest and Lung Exam ?Auscultation: ?Breath Sounds:-Normal. ? ?Cardiovascular ?Auscultation:Rythm- Regular. ?Murmurs & Other Heart Sounds:Auscultation of the heart reveals- No Murmurs. ? ?Abdomen ?Inspection:-Inspeection Normal. ?Palpation/Percussion:Note:No mass. Palpation and Percussion of the abdomen reveal- Non Tender, Non Distended + BS, no rebound or guarding. ? ? ? ?Neurologic ?Cranial Nerve exam:- CN III-XII intact(No nystagmus), symmetric smile. ?Strength:- 5/5 equal and symmetric strength both upper and lower extremities.  ? ? ?   ?Assessment & Plan:  ? ?Diverticulitis with perforation. Clinically improved now after admission and antibiotics. Will repeat cbc today. Also will go ahead and repeat cbc. Keep you appointment with surgeon to evaluate the  area. Pt has appt on Mar 24, 2022. ? ?Continue augmentin until finished. If pain returns as surgeon appt pending let me know. ? ?For htn continue amlodipine. Bp 130/80. You have some higher readings. Check bp daily. If bp level over 140/90 then do 2 additional readings. Send me bp update in one week. You may need additional medication. ? ?For smoking cessation consider wellbturin. If you want to use let me know and will prescribe. ? ?Follow up 1 month or sooner if needed. ? ?Mackie Pai, PA-C  ?

## 2022-03-12 ENCOUNTER — Encounter: Payer: Self-pay | Admitting: Medical

## 2022-04-04 ENCOUNTER — Other Ambulatory Visit: Payer: Self-pay | Admitting: Medical

## 2022-04-10 ENCOUNTER — Ambulatory Visit: Payer: Managed Care, Other (non HMO) | Admitting: Medical

## 2022-04-15 ENCOUNTER — Ambulatory Visit: Payer: Managed Care, Other (non HMO) | Admitting: Medical

## 2022-05-27 ENCOUNTER — Ambulatory Visit: Payer: Managed Care, Other (non HMO) | Admitting: Physician Assistant

## 2022-11-30 ENCOUNTER — Emergency Department (HOSPITAL_BASED_OUTPATIENT_CLINIC_OR_DEPARTMENT_OTHER): Payer: Managed Care, Other (non HMO)

## 2022-11-30 ENCOUNTER — Encounter (HOSPITAL_BASED_OUTPATIENT_CLINIC_OR_DEPARTMENT_OTHER): Payer: Self-pay | Admitting: Emergency Medicine

## 2022-11-30 ENCOUNTER — Emergency Department (HOSPITAL_BASED_OUTPATIENT_CLINIC_OR_DEPARTMENT_OTHER)
Admission: EM | Admit: 2022-11-30 | Discharge: 2022-11-30 | Disposition: A | Discharge status: Home / Self Care | Payer: Managed Care, Other (non HMO) | Attending: Emergency Medicine | Admitting: Emergency Medicine

## 2022-11-30 ENCOUNTER — Other Ambulatory Visit: Payer: Self-pay

## 2022-11-30 DIAGNOSIS — I10 Essential (primary) hypertension: Secondary | ICD-10-CM

## 2022-11-30 DIAGNOSIS — R0789 Other chest pain: Secondary | ICD-10-CM | POA: Diagnosis not present

## 2022-11-30 DIAGNOSIS — R55 Syncope and collapse: Secondary | ICD-10-CM | POA: Insufficient documentation

## 2022-11-30 LAB — BASIC METABOLIC PANEL
Anion gap: 11 (ref 5–15)
BUN: 13 mg/dL (ref 6–20)
CO2: 26 mmol/L (ref 22–32)
Calcium: 8.9 mg/dL (ref 8.9–10.3)
Chloride: 98 mmol/L (ref 98–111)
Creatinine, Ser: 0.73 mg/dL (ref 0.61–1.24)
GFR, Estimated: >60 mL/min (ref >60)
Glucose, Bld: 112 mg/dL — ABNORMAL HIGH (ref 70–99)
Potassium: 4 mmol/L (ref 3.5–5.1)
Sodium: 135 mmol/L (ref 135–145)

## 2022-11-30 LAB — CBC WITH DIFFERENTIAL/PLATELET
Abs Immature Granulocytes: 0.04 10*3/uL (ref 0.00–0.07)
Basophils Absolute: 0.1 10*3/uL (ref 0.0–0.1)
Basophils Relative: 1 %
Eosinophils Absolute: 0.1 10*3/uL (ref 0.0–0.5)
Eosinophils Relative: 1 %
HCT: 46.5 % (ref 39.0–52.0)
Hemoglobin: 16.4 g/dL (ref 13.0–17.0)
Immature Granulocytes: 1 %
Lymphocytes Relative: 9 %
Lymphs Abs: 0.7 10*3/uL (ref 0.7–4.0)
MCH: 32.8 pg (ref 26.0–34.0)
MCHC: 35.3 g/dL (ref 30.0–36.0)
MCV: 93 fL (ref 80.0–100.0)
Monocytes Absolute: 0.9 10*3/uL (ref 0.1–1.0)
Monocytes Relative: 11 %
Neutro Abs: 6.2 10*3/uL (ref 1.7–7.7)
Neutrophils Relative %: 77 %
Platelets: 174 10*3/uL (ref 150–400)
RBC: 5 MIL/uL (ref 4.22–5.81)
RDW: 11.9 % (ref 11.5–15.5)
WBC: 7.9 10*3/uL (ref 4.0–10.5)
nRBC: 0 % (ref 0.0–0.2)

## 2022-11-30 LAB — TROPONIN I (HIGH SENSITIVITY)
Troponin I (High Sensitivity): 8 ng/L (ref <18)
Troponin I (High Sensitivity): 7 ng/L (ref <18)

## 2022-11-30 MED ORDER — HYDROCODONE-ACETAMINOPHEN 5-325 MG PO TABS: 1.0000 | ORAL_TABLET | ORAL | 0 refills | Status: DC | PRN

## 2022-11-30 MED ORDER — ACETAMINOPHEN 500 MG PO TABS
1000.0000 mg | ORAL_TABLET | Freq: Once | ORAL | Status: AC
  Administered 2022-11-30: 1000 mg via ORAL
  Filled 2022-11-30: qty 2

## 2022-11-30 MED ORDER — AMLODIPINE BESYLATE 10 MG PO TABS: 10.0000 mg | ORAL_TABLET | Freq: Every day | ORAL | 0 refills | Status: DC

## 2022-11-30 MED ORDER — LIDOCAINE 5 % EX PTCH
2.0000 | MEDICATED_PATCH | CUTANEOUS | Status: DC
  Administered 2022-11-30: 2 via TRANSDERMAL
  Filled 2022-11-30: qty 2

## 2022-11-30 MED ORDER — KETOROLAC TROMETHAMINE 30 MG/ML IJ SOLN
30.0000 mg | Freq: Once | INTRAMUSCULAR | Status: AC
  Administered 2022-11-30: 30 mg via INTRAVENOUS
  Filled 2022-11-30: qty 1

## 2022-11-30 MED ORDER — IOHEXOL 350 MG/ML SOLN
100.0000 mL | Freq: Once | INTRAVENOUS | Status: AC | PRN
  Administered 2022-11-30: 75 mL via INTRAVENOUS

## 2022-11-30 NOTE — ED Triage Notes (Signed)
Pt states he went to use the restroom and woke up on the floor with pain to R ribs. He states he "felt fine" before getting up to go to the bathroom. He had been asleep for maybe an hour before this. He remembers walking to the bathroom, turning on the light and walking toward the toilet, "and then it was lights out". Pt guarding R ribcage. No bruising noted at this time. Pt able to speak in complete sentences, but shallow breathing.

## 2022-11-30 NOTE — ED Provider Notes (Signed)
[facility]ENC[facility] DEPARTMENT Provider Note   CSN: [id number] Arrival date & time: 11/30/22  0532     History  Chief Complaint  Patient presents with   Loss of Consciousness    Christian Peterson is a 52 y.o. male.  The history is provided by the patient.  Loss of Consciousness Episode history:  Single Most recent episode:  Yesterday Timing:  Constant Progression:  Resolved Chronicity:  New Context: standing up and urination   Witnessed: no   Relieved by:  Nothing Worsened by:  Nothing Ineffective treatments:  None tried Associated symptoms: chest pain   Associated symptoms: no fever   Associated symptoms comment:  Right anterior rib pain  Risk factors: no coronary artery disease   Patient with GERD who presents with syncopal event in the bathroom and now right low chest/rib pain     Past Medical History:  Diagnosis Date   Allergy    Cellulitis    R foot   GERD (gastroesophageal reflux disease)    occ with spicy foods      Home Medications Prior to Admission medications   Medication Sig Start Date End Date Taking? Authorizing Provider  amLODipine (NORVASC) 10 MG tablet Take 1 tablet (10 mg total) by mouth daily. 03/08/22   Aline August, MD  famotidine (PEPCID) 20 MG tablet TAKE 1 TABLET BY MOUTH TWICE A DAY 04/06/22   Saguier, Percell Miller, PA-C  ibuprofen (ADVIL) 200 MG tablet Take 400 mg by mouth every 6 (six) hours as needed for mild pain or moderate pain.    [provider]  ondansetron (ZOFRAN) 4 MG tablet Take 1 tablet (4 mg total) by mouth every 8 (eight) hours as needed for nausea or vomiting. 03/05/22   Saguier, Percell Miller, PA-C  traMADol (ULTRAM) 50 MG tablet Take 1 tablet (50 mg total) by mouth every 6 (six) hours as needed for moderate pain. 03/08/22   Aline August, MD      Allergies    Patient has no known allergies.    Review of Systems   Review of Systems  Constitutional:  Negative for fever.  HENT:  Negative for facial swelling.    Eyes:  Negative for redness.  Cardiovascular:  Positive for chest pain and syncope.  Neurological:  Positive for syncope.    Physical Exam Updated Vital Signs BP (!) 183/127 (BP Location: Right Arm)   Pulse (!) 104   Resp (!) 24 Comment: breathing shallow due to rib pain  Ht '5\' 8"'$  (1.727 m)   Wt 70.3 kg   SpO2 99%   BMI 23.57 kg/m  Physical Exam Vitals and nursing note reviewed. Exam conducted with a chaperone present.  Constitutional:      General: He is not in acute distress.    Appearance: Normal appearance. He is well-developed. He is not diaphoretic.  HENT:     Head: Normocephalic and atraumatic.     Nose: Nose normal.  Eyes:     Conjunctiva/sclera: Conjunctivae normal.     Pupils: Pupils are equal, round, and reactive to light.  Cardiovascular:     Rate and Rhythm: Normal rate and regular rhythm.  Pulmonary:     Effort: Pulmonary effort is normal.     Breath sounds: Normal breath sounds. No wheezing or rales.  Chest:     Chest wall: Tenderness present.  Abdominal:     General: Bowel sounds are normal.     Palpations: Abdomen is soft.     Tenderness: There is no  abdominal tenderness. There is no guarding or rebound.  Musculoskeletal:        General: No tenderness or deformity. Normal range of motion.     Cervical back: Normal range of motion and neck supple.     Right lower leg: No edema.     Left lower leg: No edema.  Skin:    General: Skin is warm and dry.     Capillary Refill: Capillary refill takes less than 2 seconds.  Neurological:     General: No focal deficit present.     Mental Status: He is alert and oriented to person, place, and time.     Deep Tendon Reflexes: Reflexes normal.  Psychiatric:        Mood and Affect: Mood normal.        Behavior: Behavior normal.     ED Results / Procedures / Treatments   Labs (all labs ordered are listed, but only abnormal results are displayed) Results for orders placed or performed during the hospital  encounter of 11/30/22  CBC with Differential  Result Value Ref Range   WBC 7.9 4.0 - 10.5 K/uL   RBC 5.00 4.22 - 5.81 MIL/uL   Hemoglobin 16.4 13.0 - 17.0 g/dL   HCT 46.5 39.0 - 52.0 %   MCV 93.0 80.0 - 100.0 fL   MCH 32.8 26.0 - 34.0 pg   MCHC 35.3 30.0 - 36.0 g/dL   RDW 11.9 11.5 - 15.5 %   Platelets 174 150 - 400 K/uL   nRBC 0.0 0.0 - 0.2 %   Neutrophils Relative % 77 %   Neutro Abs 6.2 1.7 - 7.7 K/uL   Lymphocytes Relative 9 %   Lymphs Abs 0.7 0.7 - 4.0 K/uL   Monocytes Relative 11 %   Monocytes Absolute 0.9 0.1 - 1.0 K/uL   Eosinophils Relative 1 %   Eosinophils Absolute 0.1 0.0 - 0.5 K/uL   Basophils Relative 1 %   Basophils Absolute 0.1 0.0 - 0.1 K/uL   Immature Granulocytes 1 %   Abs Immature Granulocytes 0.04 0.00 - 0.07 K/uL   DG Chest Portable 1 View  Result Date: 11/30/2022 CLINICAL DATA:  Pt states he went to use the restroom and woke up on the floor with pain to R ribs. EXAM: PORTABLE CHEST 1 VIEW COMPARISON:  11/04/2011 . FINDINGS: Heart size and mediastinal contours are normal. There is no pleural effusion or edema. No airspace opacities. No acute osseous findings. IMPRESSION: No active disease. Electronically Signed   By: Kerby Moors M.D.   On: 11/30/2022 06:40    EKG EKG Interpretation  Date/Time:  Monday November 30 2022 05:43:24 EST Ventricular Rate:  101 PR Interval:  156 QRS Duration: 90 QT Interval:  344 QTC Calculation: 446 R Axis:   69 Text Interpretation: Sinus tachycardia Confirmed by Dory Horn) on 11/30/2022 5:49:02 AM  Radiology DG Chest Portable 1 View  Result Date: 11/30/2022 CLINICAL DATA:  Pt states he went to use the restroom and woke up on the floor with pain to R ribs. EXAM: PORTABLE CHEST 1 VIEW COMPARISON:  11/04/2011 . FINDINGS: Heart size and mediastinal contours are normal. There is no pleural effusion or edema. No airspace opacities. No acute osseous findings. IMPRESSION: No active disease. Electronically Signed   By:  Kerby Moors M.D.   On: 11/30/2022 06:40    Procedures Procedures    Medications Ordered in ED Medications  lidocaine (LIDODERM) 5 % 2 patch (has no administration in time  range)  acetaminophen (TYLENOL) tablet 1,000 mg (has no administration in time range)  ketorolac (TORADOL) 30 MG/ML injection 30 mg (30 mg Intravenous Given 11/30/22 9892)    ED Course/ Medical Decision Making/ A&P                             Medical Decision Making Patient with syncopal event in the bathroom now with right lower anterior rib pain   Amount and/or Complexity of Data Reviewed Independent Historian: spouse    Details: See above  External Data Reviewed: notes.    Details: Previous notes reviewed  Labs: ordered.    Details: All labs reviewed: normal white count 7.9, normal hemoglobin 16.4, normal platelet count 174,000 Radiology: ordered.    Details: CXR negative by my read.  CT head and CTA ordered by me  ECG/medicine tests: ordered and independent interpretation performed. Decision-making details documented in ED Course.  Risk OTC drugs. Prescription drug management.    Final Clinical Impression(s) / ED Diagnoses  Signed out to Dr. Tamera Punt pending labs and imaging       Honorio Devol, MD 11/30/22 2301

## 2022-11-30 NOTE — ED Provider Notes (Signed)
Patient care was taken over from Dr. Nicholes Stairs.  In brief, patient walked to the restroom during the night and he remembers turning on the light and walking to the toilet but then the next day or members was trying to pick up some toilet paper out of the bathtub.  He is not sure if he passed out or if he fell.  Assuming since he does not remove the whole thing that he may have had a syncopal episode.  He seems to have injured his right rib cage.  He has tenderness in this area.  Denies any other injuries.  We have not seen any arrhythmias noted on monitoring.  His EKG does not show any ischemic changes.  He had imaging including a CT of the head which was negative for acute injury.  CT of the chest PE protocol does not show any evidence of PE.  There is no pneumonia.  No obvious rib fracture.  His labs are nonconcerning.  He does not have any evidence of ACS.  Overall he is well-appearing and ready to be discharged.  His heart rate is a little bit on the high side in the 90s.  I offered to give him some IV fluids but at this point he is refusing and says he is ready to go home.  Will send him home with an incentive spirometer.  Will give him a short course of Vicodin.  Also noted his blood pressure is markedly elevated.  He states that they started him on amlodipine during a recent admission for diverticulitis but he stopped taking it.  Will start him back on the amlodipine and advised him to have close follow-up within this week with his primary care doctor.  Return precautions were given.   Malvin Johns, MD 11/30/22 [phone]

## 2022-12-02 ENCOUNTER — Telehealth: Payer: Self-pay

## 2022-12-02 NOTE — Telephone Encounter (Signed)
Transition Care Management Unsuccessful Follow-up Telephone Call  Date of discharge and from where:  [facility], ER 11/30/2022  Attempts:  1st Attempt  Reason for unsuccessful TCM follow-up call:  No answer/busy

## 2022-12-03 NOTE — Telephone Encounter (Signed)
Transition Care Management Unsuccessful Follow-up Telephone Call  Date of discharge and from where:  [facility] ER 11/30/2022  Attempts:  2nd Attempt  Reason for unsuccessful TCM follow-up call:  No answer/busy

## 2022-12-08 NOTE — Telephone Encounter (Signed)
Transition Care Management Unsuccessful Follow-up Telephone Call  Date of discharge and from where:  [facility] ER 11/30/2022  Attempts:  3rd Attempt  Reason for unsuccessful TCM follow-up call:  No answer/busy  Pt scheduled with PCP on 12/09/22.

## 2022-12-09 ENCOUNTER — Encounter: Payer: Self-pay | Admitting: Medical

## 2022-12-09 ENCOUNTER — Encounter: Payer: Self-pay | Admitting: Neurology

## 2022-12-09 ENCOUNTER — Ambulatory Visit (INDEPENDENT_AMBULATORY_CARE_PROVIDER_SITE_OTHER): Payer: Managed Care, Other (non HMO) | Admitting: Medical

## 2022-12-09 ENCOUNTER — Ambulatory Visit (HOSPITAL_BASED_OUTPATIENT_CLINIC_OR_DEPARTMENT_OTHER)
Admission: RE | Admit: 2022-12-09 | Discharge: 2022-12-09 | Disposition: A | Discharge status: Home / Self Care | Payer: Managed Care, Other (non HMO) | Source: Ambulatory Visit | Attending: Medical | Admitting: Medical

## 2022-12-09 VITALS — BP 163/98 | HR 100 | Resp 18 | Ht 68.0 in | Wt 158.0 lb

## 2022-12-09 DIAGNOSIS — R0781 Pleurodynia: Secondary | ICD-10-CM | POA: Diagnosis not present

## 2022-12-09 DIAGNOSIS — R55 Syncope and collapse: Secondary | ICD-10-CM | POA: Diagnosis not present

## 2022-12-09 DIAGNOSIS — R739 Hyperglycemia, unspecified: Secondary | ICD-10-CM | POA: Diagnosis not present

## 2022-12-09 LAB — HEMOGLOBIN A1C: Hgb A1c MFr Bld: 5.6 % (ref 4.6–6.5)

## 2022-12-09 MED ORDER — AMLODIPINE BESYLATE 10 MG PO TABS: 10.0000 mg | ORAL_TABLET | Freq: Every day | ORAL | 3 refills | Status: DC

## 2022-12-09 MED ORDER — HYDROCODONE-ACETAMINOPHEN 5-325 MG PO TABS: ORAL_TABLET | ORAL | 0 refills | Status: DC

## 2022-12-09 MED ORDER — LOSARTAN POTASSIUM 50 MG PO TABS: 50.0000 mg | ORAL_TABLET | Freq: Every day | ORAL | 11 refills | Status: DC

## 2022-12-09 NOTE — Progress Notes (Signed)
Subjective:    Patient ID: Christian Peterson, male    DOB: 22-May-1971, 52 y.o.   MRN: [id number]  HPI  Pt in for follow up from ED.  Visit date was 11/30/2022.  Chief Complaint  Patient presents with   Loss of Consciousness      Christian Peterson is a 52 y.o. male.   The history is provided by the patient.  Loss of Consciousness Episode history:  Single Most recent episode:  Yesterday Timing:  Constant Progression:  Resolved Chronicity:  New Context: standing up and urination   Witnessed: no   Relieved by:  Nothing Worsened by:  Nothing Ineffective treatments:  None tried Associated symptoms: chest pain   Associated symptoms: no fever   Associated symptoms comment:  Right anterior rib pain  Risk factors: no coronary artery disease   Patient with GERD who presents with syncopal event in the bathroom and now right low chest/rib pain    Review of Systems   Review of Systems  Constitutional:  Negative for fever.  HENT:  Negative for facial swelling.   Eyes:  Negative for redness.  Cardiovascular:  Positive for chest pain and syncope.  Neurological:  Positive for syncope.   Medical Decision Making Patient with syncopal event in the bathroom now with right lower anterior rib pain    Amount and/or Complexity of Data Reviewed Independent Historian: spouse    Details: See above  External Data Reviewed: notes.    Details: Previous notes reviewed  Labs: ordered.    Details: All labs reviewed: normal white count 7.9, normal hemoglobin 16.4, normal platelet count 174,000 Radiology: ordered.    Details: CXR negative by my read.  CT head and CTA ordered by me  ECG/medicine tests: ordered and independent interpretation performed. Decision-making details documented in ED Course.   Risk OTC drugs. Prescription drug management."   Bp at time was in ED 183/127.  He tells me night of events he got up to use bathroom and when walking /about 3 times in bathroom fell. No  preceding chest pain, cardiac pain, palpitation or dizziness.   Pt wife was not asleep yet and she heard him fall. His loss of consiousness was second to minute as his wife was awake and in kitchen. She heard him fall.  Since leaving ED only having rt upper rib region pain. No cardiac or neurologic signs or symptoms.    Rt ribs hurt with minimal movement of thorac/twisting. At night when he rolls over has pain.   Pt has not been working since the event. 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  He had 2 beers night of syncopal event.  Day of fall and after only had rib pain. No diffuse muscle aches. No ha night of syncopal event.   Review cxr, ct chest and head ct.  Pt had norco for pain . He has been using ibuprofen as well. He has 3 tab of norco left.   He has hx of gerd. Some belching on exam.   Htn- bp was high in ED still high now but less.   Pt took ibuprofen today. Last 8 am  today.  This would be pt first syncopal event.  Review of Systems  Constitutional:  Negative for diaphoresis and fatigue.  Respiratory:  Negative for cough, choking, shortness of breath and wheezing.   Cardiovascular:  Negative for chest pain and palpitations.  Gastrointestinal:  Negative for  abdominal pain.  Genitourinary:  Negative for dysuria.  Musculoskeletal:  Negative for back pain.       Rt side rib pain.  Neurological:  Negative for facial asymmetry and headaches.  Hematological:  Negative for adenopathy. Does not bruise/bleed easily.  Psychiatric/Behavioral:  Negative for decreased concentration.    Past Medical History:  Diagnosis Date   Allergy    Cellulitis    R foot   GERD (gastroesophageal reflux disease)    occ with spicy foods      Social History   Socioeconomic History   Marital status: Married    Spouse name: Not on file   Number of children: Not on file   Years of education: Not on file   Highest education level:  Not on file  Occupational History   Not on file  Tobacco Use   Smoking status: Every Day    Packs/day: 1.00    Types: Cigarettes   Smokeless tobacco: Never  Substance and Sexual Activity   Alcohol use: Yes    Comment: Drinks 5 out of 7 days.  Mostly beer.  Some liquor.   Drug use: Yes    Types: Marijuana    Comment: LAST SMOKED 08/11/20   Sexual activity: Yes  Other Topics Concern   Not on file  Social History Narrative   Not on file   Social Determinants of Health   Financial Resource Strain: Not on file  Food Insecurity: Not on file  Transportation Needs: Not on file  Physical Activity: Not on file  Stress: Not on file  Social Connections: Not on file  Intimate Partner Violence: Not on file    Past Surgical History:  Procedure Laterality Date   ANKLE FRACTURE SURGERY     plate    WISDOM TOOTH EXTRACTION      Family History  Adopted: Yes    No Known Allergies  Current Outpatient Medications on File Prior to Visit  Medication Sig Dispense Refill   amLODipine (NORVASC) 10 MG tablet Take 1 tablet (10 mg total) by mouth daily. 30 tablet 0   amLODipine (NORVASC) 10 MG tablet Take 1 tablet (10 mg total) by mouth daily. 30 tablet 0   famotidine (PEPCID) 20 MG tablet TAKE 1 TABLET BY MOUTH TWICE A DAY 60 tablet 0   HYDROcodone-acetaminophen (NORCO/VICODIN) 5-325 MG tablet Take 1-2 tablets by mouth every 4 (four) hours as needed. 10 tablet 0   ibuprofen (ADVIL) 200 MG tablet Take 400 mg by mouth every 6 (six) hours as needed for mild pain or moderate pain.     ondansetron (ZOFRAN) 4 MG tablet Take 1 tablet (4 mg total) by mouth every 8 (eight) hours as needed for nausea or vomiting. 20 tablet 0   traMADol (ULTRAM) 50 MG tablet Take 1 tablet (50 mg total) by mouth every 6 (six) hours as needed for moderate pain. 14 tablet 0   No current facility-administered medications on file prior to visit.    BP (!) 163/98   Pulse 100   Resp 18   Ht '5\' 8"'$  (1.727 m)   Wt 158 lb  (71.7 kg)   SpO2 98%   BMI 24.02 kg/m         Objective:   Physical Exam  General- No acute distress. Pleasant patient. Neck- Full range of motion, no jvd, no tracheal deviation. Lungs- Clear, even and unlabored. Heart- regular rate and rhythm. Neurologic- CNII- XII grossly intact. EOM intact. Upper and lower ext strength 5/5.  Anterior  thorax- rt side rib tendernss at level just beneath pectoralis. Mid axillary line. No bruising in this area. No crepitus.      Assessment & Plan:   Patient Instructions  Syncopal event on 11/30/2022. Work up did not identify cause. CT head cxr, ct chest, EKG and labs done. 1st event/no other prior per pt. Will place referral to cardiologist and neurologist.  If pending referral you have recurrent event advise ED evaluation.  Make sure you stay well hydrated and be careful on position change. Make sure you get balance before ambulating.  Htn- bp is high today but not as high as in ED. Continue amlodipine 10 mg daily and add on losartan 50 mg daily. Also advise stop nsaids presently as can raise blood pressure.  For rt rib pain will repeat rt rib series/chest to see if second xray shows fracture. Will refill your norco for severe pain.   Follow up 7-10 days or sooner  if needed.    Mackie Pai, PA-C

## 2022-12-09 NOTE — Patient Instructions (Addendum)
Syncopal event on 11/30/2022. Work up did not identify cause. CT head cxr, ct chest, EKG and labs done. 1st event/no other prior per pt. Will place referral to cardiologist and neurologist.  If pending referral you have recurrent event advise ED evaluation.  Make sure you stay well hydrated and be careful on position change. Make sure you get balance before ambulating.  Htn- bp is high today but not as high as in ED. Continue amlodipine 10 mg daily and add on losartan 50 mg daily. Also advise stop nsaids presently as can raise blood pressure.  For rt rib pain will repeat rt rib series/chest to see if second xray shows fracture. Will refill your norco for severe pain.   Elevated sugar on lab review. Will get A1c today.  Follow up 7-10 days or sooner  if needed.

## 2022-12-16 ENCOUNTER — Encounter: Payer: Self-pay | Admitting: Medical

## 2022-12-16 ENCOUNTER — Ambulatory Visit (INDEPENDENT_AMBULATORY_CARE_PROVIDER_SITE_OTHER): Payer: Managed Care, Other (non HMO) | Admitting: Medical

## 2022-12-16 VITALS — BP 140/80 | HR 107 | Temp 97.8°F | Resp 18 | Ht 68.0 in | Wt 158.0 lb

## 2022-12-16 DIAGNOSIS — F17200 Nicotine dependence, unspecified, uncomplicated: Secondary | ICD-10-CM

## 2022-12-16 DIAGNOSIS — I10 Essential (primary) hypertension: Secondary | ICD-10-CM | POA: Diagnosis not present

## 2022-12-16 DIAGNOSIS — R55 Syncope and collapse: Secondary | ICD-10-CM | POA: Diagnosis not present

## 2022-12-16 DIAGNOSIS — R0781 Pleurodynia: Secondary | ICD-10-CM

## 2022-12-16 DIAGNOSIS — F17210 Nicotine dependence, cigarettes, uncomplicated: Secondary | ICD-10-CM

## 2022-12-16 NOTE — Progress Notes (Signed)
Subjective:    Patient ID: Christian Peterson, male    DOB: 1971/04/13, 52 y.o.   MRN: [id number]  HPI  Pt in for follow up form 12-09-2022.  Below is that visit AVS.  "Syncopal event on 11/30/2022. Work up did not identify cause. CT head cxr, ct chest, EKG and labs done. 1st event/no other prior per pt. Will place referral to cardiologist and neurologist.   If pending referral you have recurrent event advise ED evaluation.   Make sure you stay well hydrated and be careful on position change. Make sure you get balance before ambulating.   Htn- bp is high today but not as high as in ED. Continue amlodipine 10 mg daily and add on losartan 50 mg daily. Also advise stop nsaids presently as can raise blood pressure.   For rt rib pain will repeat rt rib series/chest to see if second xray shows fracture. Will refill your norco for severe pain."  Pt states he is feeling better overall. No dizzy episodes or near syncope episodes.  Pt has not been checking his bp in past week.   Pt rt rib pain has been gradually tapering off. He only has taken 3-4 of norco and usually just at night. Pain in rib sneezing, yawning, twisting and bending over. Pain is a lot better. No fever or chills. Pt states has been breathing deep to avoid infection.  MPRESSION: Newly seen atelectasis at the right lung base. No pneumothorax or hemothorax. No visible rib fracture. Costal cartilage injury not excluded.     Review of Systems  Constitutional:  Negative for chills, fatigue and fever.  Respiratory:  Negative for cough, chest tightness, shortness of breath and wheezing.   Cardiovascular:  Negative for chest pain and palpitations.  Gastrointestinal:  Negative for abdominal pain, diarrhea, rectal pain and vomiting.  Endocrine: Negative for polydipsia, polyphagia and polyuria.  Genitourinary:  Negative for dysuria, flank pain and frequency.  Musculoskeletal:  Negative for back pain, myalgias and neck stiffness.   Skin:  Negative for rash.    Past Medical History:  Diagnosis Date   Allergy    Cellulitis    R foot   GERD (gastroesophageal reflux disease)    occ with spicy foods      Social History   Socioeconomic History   Marital status: Married    Spouse name: Not on file   Number of children: Not on file   Years of education: Not on file   Highest education level: Not on file  Occupational History   Not on file  Tobacco Use   Smoking status: Every Day    Packs/day: 1.00    Types: Cigarettes   Smokeless tobacco: Never  Substance and Sexual Activity   Alcohol use: Yes    Comment: Drinks 5 out of 7 days.  Mostly beer.  Some liquor.   Drug use: Yes    Types: Marijuana    Comment: LAST SMOKED 08/11/20   Sexual activity: Yes  Other Topics Concern   Not on file  Social History Narrative   Not on file   Social Determinants of Health   Financial Resource Strain: Not on file  Food Insecurity: Not on file  Transportation Needs: Not on file  Physical Activity: Not on file  Stress: Not on file  Social Connections: Not on file  Intimate Partner Violence: Not on file    Past Surgical History:  Procedure Laterality Date   ANKLE FRACTURE SURGERY  plate    WISDOM TOOTH EXTRACTION      Family History  Adopted: Yes    No Known Allergies  Current Outpatient Medications on File Prior to Visit  Medication Sig Dispense Refill   amLODipine (NORVASC) 10 MG tablet Take 1 tablet (10 mg total) by mouth daily. 30 tablet 0   amLODipine (NORVASC) 10 MG tablet Take 1 tablet (10 mg total) by mouth daily. 30 tablet 0   amLODipine (NORVASC) 10 MG tablet Take 1 tablet (10 mg total) by mouth daily. 90 tablet 3   famotidine (PEPCID) 20 MG tablet TAKE 1 TABLET BY MOUTH TWICE A DAY 60 tablet 0   HYDROcodone-acetaminophen (NORCO) 5-325 MG tablet 1 tab po q 6 hours prn severe pain 20 tablet 0   ibuprofen (ADVIL) 200 MG tablet Take 400 mg by mouth every 6 (six) hours as needed for mild pain or  moderate pain.     losartan (COZAAR) 50 MG tablet Take 1 tablet (50 mg total) by mouth daily. 30 tablet 11   ondansetron (ZOFRAN) 4 MG tablet Take 1 tablet (4 mg total) by mouth every 8 (eight) hours as needed for nausea or vomiting. 20 tablet 0   No current facility-administered medications on file prior to visit.    BP (!) 140/80   Pulse (!) 107   Temp 97.8 F (36.6 C) (Oral)   Resp 18   Ht '5\' 8"'$  (1.727 m)   Wt 158 lb (71.7 kg)   SpO2 97%   BMI 24.02 kg/m        Objective:   Physical Exam   General Mental Status- Alert. General Appearance- Not in acute distress.   Skin General: Color- Normal Color. Moisture- Normal Moisture.  Neck Carotid Arteries- Normal color. Moisture- Normal Moisture. No carotid bruits. No JVD.  Chest and Lung Exam Auscultation: Breath Sounds:-Normal.  Cardiovascular Auscultation:Rythm- Regular. Murmurs & Other Heart Sounds:Auscultation of the heart reveals- No Murmurs.  Abdomen Inspection:-Inspeection Normal. Palpation/Percussion:Note:No mass. Palpation and Percussion of the abdomen reveal- Non Tender, Non Distended + BS, no rebound or guarding.   Neurologic Cranial Nerve exam:- CN III-XII intact(No nystagmus), symmetric smile. Drift Test:- No drift. Finger to Nose:- Normal/Intact Strength:- 5/5 equal and symmetric strength both upper and lower extremities.    Rt side ribs- still mild tender.      Assessment & Plan:   Patient Instructions  Htn- continue amlodipine 10 mg daily and losartan 50 mg daily. Check bp at home when relaxed but not immediatley after smoking. Reminder not to use nsaids.  For rib pain you have norco to use if needed. Glad to see needing sparingly.   No recurrent syncope episodes. Do recommend you keep both cardiology and neurologist referrals.  Smoking cessation. Ask you update me in one week. If bp like today or lower will rx wellbutrin for smoking cessation. Rx advisement given.  Follow up in one month  or sooner if needed.    Mackie Pai, PA-C

## 2022-12-16 NOTE — Patient Instructions (Addendum)
Htn- continue amlodipine 10 mg daily and losartan 50 mg daily. Check bp at home when relaxed but not immediately after smoking. Reminder not to use nsaids.  For rib pain you have norco to use if needed. Glad to see needing sparingly.   No recurrent syncope episodes. Do recommend you keep both cardiology and neurologist referrals.  Smoking cessation. Ask you update me in one week. If bp like today or lower will rx wellbutrin for smoking cessation. Rx advisement given.  Follow up in one month or sooner if needed.

## 2023-01-05 ENCOUNTER — Ambulatory Visit (INDEPENDENT_AMBULATORY_CARE_PROVIDER_SITE_OTHER): Payer: Managed Care, Other (non HMO) | Admitting: Neurology

## 2023-01-05 ENCOUNTER — Encounter: Payer: Self-pay | Admitting: Neurology

## 2023-01-05 VITALS — BP 165/101 | HR 99 | Ht 68.0 in | Wt 162.6 lb

## 2023-01-05 DIAGNOSIS — R55 Syncope and collapse: Secondary | ICD-10-CM

## 2023-01-05 NOTE — Progress Notes (Signed)
NEUROLOGY CONSULTATION NOTE  Christian Peterson MRN: [id number] DOB: 06-Oct-1971  Referring provider: Mackie Pai, PA-C Primary care provider: Mackie Pai, PA-C  Reason for consult:  syncope   Thank you for your kind referral of Christian Peterson for consultation of the above symptoms. Although his history is well known to you, please allow me to reiterate it for the purpose of our medical record. He is alone in the office today. Records and images were personally reviewed where available.   HISTORY OF PRESENT ILLNESS: This is a pleasant 52 year old left-handed man with a history of hypertension, presenting for evaluation of an episode of loss of consciousness that occurred on 11/30/22. He was in his usual state of health the night prior, he ate dinner and had 4-5 beers watching the game, went to bed around 10pm. He got up at 11:30pm for chocolate and milk, which is his routine, then got back in bed. At around 12-12:30am he got up to use the bathroom, recalls opening the door and flipping the light on, then took maybe 2 or 3 steps then came to with his wife asking what happened. He was sitting getting toilet paper out of the tub, he did not know what happened. He hit the right side of his chest and had significant pain that continued to worsen so he went to the ER at 5am. No tongue bite, incontinence, focal weakness. He was able to talk when his wife came to him, she did not report any convulsive activity. In the ER, BP was 183/127. CBC, BMP normal. EKG showed sinus tachycardia at 101 bpm. Head CT no acute changes.  He denies any staring/unresponsive episodes, gaps in time, olfactory/gustatory hallucinations, deja vu, rising epigastric sensation, focal numbness/tingling/weakness, myoclonic jerks. No headaches, dizziness, vision changes, neck/back pain, bowel/bladder dysfunction. He has bilateral knee issues. Sometimes his left hand may be a little shaky in the mornings, he notices it when he uses  a spoon. He reports a head injury in 1995 while body boarding and landing on his head, no loss of consciousness. BP today 165/101, he has not taken his BP meds yet, at home BP runs 140-150/80-100 without medication, a few points lower after taking medications. He drinks 3-5 beers 4-5 days a week. He was adopted and does not know much about family history. He had a normal birth and early development.  There is no history of febrile convulsions, CNS infections such as meningitis/encephalitis, significant traumatic brain injury, neurosurgical procedures, or family history of seizures.   PAST MEDICAL HISTORY: Past Medical History:  Diagnosis Date   Allergy    Cellulitis    R foot   GERD (gastroesophageal reflux disease)    occ with spicy foods     PAST SURGICAL HISTORY: Past Surgical History:  Procedure Laterality Date   ANKLE FRACTURE SURGERY     plate    WISDOM TOOTH EXTRACTION      MEDICATIONS: Current Outpatient Medications on File Prior to Visit  Medication Sig Dispense Refill   amLODipine (NORVASC) 10 MG tablet Take 1 tablet (10 mg total) by mouth daily. 90 tablet 3   famotidine (PEPCID) 20 MG tablet TAKE 1 TABLET BY MOUTH TWICE A DAY 60 tablet 0   losartan (COZAAR) 50 MG tablet Take 1 tablet (50 mg total) by mouth daily. 30 tablet 11   No current facility-administered medications on file prior to visit.    ALLERGIES: No Known Allergies  FAMILY HISTORY: Family History  Adopted: Yes  SOCIAL HISTORY: Social History   Socioeconomic History   Marital status: Married    Spouse name: Not on file   Number of children: Not on file   Years of education: Not on file   Highest education level: Not on file  Occupational History   Not on file  Tobacco Use   Smoking status: Every Day    Packs/day: 1.00    Types: Cigarettes   Smokeless tobacco: Never  Vaping Use   Vaping Use: Every day  Substance and Sexual Activity   Alcohol use: Yes    Comment: Drinks 5 out of 7  days.  Mostly beer.  Some liquor.   Drug use: Yes    Types: Marijuana    Comment: LAST SMOKED 08/11/20   Sexual activity: Yes  Other Topics Concern   Not on file  Social History Narrative   Are you right handed or left handed? Left handed    Are you currently employed ? no   What is your current occupation? na   Do you live at home alone?no   Who lives with you? Wife and pets   What type of home do you live in: 1 story or 2 story? 1 story 4 or 5 steps        Social Determinants of Radio broadcast assistant Strain: Not on file  Food Insecurity: Not on file  Transportation Needs: Not on file  Physical Activity: Not on file  Stress: Not on file  Social Connections: Not on file  Intimate Partner Violence: Not on file     PHYSICAL EXAM: Vitals:   01/05/23 1023  BP: (!) 165/101  Pulse: 99  SpO2: 99%   General: No acute distress Head:  Normocephalic/atraumatic Skin/Extremities: No rash, no edema Neurological Exam: Mental status: alert and awake, no dysarthria or aphasia, Fund of knowledge is appropriate.  Recent and remote memory are intact.  Attention and concentration are normal.   Cranial nerves: CN I: not tested CN II: pupils equal, round, visual fields intact CN III, IV, VI:  full range of motion, no nystagmus, no ptosis CN V: facial sensation intact CN VII: upper and lower face symmetric CN VIII: hearing intact to conversation Bulk & Tone: normal, no fasciculations. Motor: 5/5 throughout with no pronator drift. Sensation: intact to light touch, cold, pin, vibration sense.  No extinction to double simultaneous stimulation.  Romberg test negative Deep Tendon Reflexes: +2 on both UE, +1 both LE Cerebellar: no incoordination on finger to nose testing Gait: narrow-based and steady, no ataxia Tremor: no resting tremor. There is a mild bilateral low amplitude high frequency postural tremor, no action tremor   IMPRESSION: This is a pleasant 52 year old left-handed man  with a history of hypertension, presenting for evaluation of an episode of loss of consciousness that occurred on 11/30/22. No convulsive activity reported, he was quickly back to baseline. Neurological exam normal, no clear epilepsy risk factors. Head CT normal. Discussed episode most likely vasovagal in etiology. No further neurological workup indicated at this point, he knows to call for any changes in symptoms. He is scheduled to see Cardiology as well. Discussed elevated BP in office, as well as gradual reduction in alcohol intake. Follow-up as needed,call for any changes.    Thank you for allowing me to participate in the care of this patient. Please do not hesitate to call for any questions or concerns.   Ellouise Newer, M.D.  CC: Mackie Pai, PA-C

## 2023-01-05 NOTE — Patient Instructions (Signed)
Good to meet you. Continue working on gradually reducing blood pressure. Would also slowly reduce alcohol intake. Follow-up as needed, call for any changes.

## 2023-01-07 DIAGNOSIS — L0390 Cellulitis, unspecified: Secondary | ICD-10-CM | POA: Insufficient documentation

## 2023-01-14 ENCOUNTER — Encounter: Payer: Self-pay | Admitting: Cardiology

## 2023-01-14 ENCOUNTER — Ambulatory Visit: Payer: Managed Care, Other (non HMO) | Attending: Cardiology | Admitting: Cardiology

## 2023-01-14 VITALS — BP 160/96 | HR 104 | Ht 68.0 in | Wt 164.0 lb

## 2023-01-14 DIAGNOSIS — R55 Syncope and collapse: Secondary | ICD-10-CM

## 2023-01-14 DIAGNOSIS — I10 Essential (primary) hypertension: Secondary | ICD-10-CM | POA: Insufficient documentation

## 2023-01-14 DIAGNOSIS — R0609 Other forms of dyspnea: Secondary | ICD-10-CM

## 2023-01-14 DIAGNOSIS — Z720 Tobacco use: Secondary | ICD-10-CM

## 2023-01-14 DIAGNOSIS — I700 Atherosclerosis of aorta: Secondary | ICD-10-CM

## 2023-01-14 HISTORY — DX: Atherosclerosis of aorta: I70.0

## 2023-01-14 HISTORY — DX: Other forms of dyspnea: R06.09

## 2023-01-14 HISTORY — DX: Syncope and collapse: R55

## 2023-01-14 HISTORY — DX: Essential (primary) hypertension: I10

## 2023-01-14 MED ORDER — METOPROLOL SUCCINATE ER 50 MG PO TB24: 50.0000 mg | ORAL_TABLET | Freq: Every day | ORAL | 3 refills | Status: DC

## 2023-01-14 MED ORDER — LOSARTAN POTASSIUM 100 MG PO TABS: 100.0000 mg | ORAL_TABLET | Freq: Every day | ORAL | 3 refills | Status: DC

## 2023-01-14 NOTE — Progress Notes (Signed)
Cardiology Office Note:    Date:  01/14/2023   ID:  Christian Peterson, DOB 26-Dec-1970, MRN [id number]  PCP:  Christian Pai, PA-C  Cardiologist:  Christian Lindau, MD   Referring MD: Christian Pai, PA-C    ASSESSMENT:    1. Essential hypertension   2. Aortic atherosclerosis ([place])   3. Tobacco abuse   4. Dyspnea on exertion   5. Syncope and collapse    PLAN:    In order of problems listed above:  Primary prevention stressed with the patient.  Importance of compliance with diet medication stressed and vocalized understanding. Essential hypertension: Blood pressure is elevated.  Heart rates are also elevated.  I discontinued amlodipine.  I doubled his Cozaar and added Toprol-XL 50 mg to his regimen.  He will keep a track of his pulse blood pressure and bring it to [place] back in a week.  Salt intake issues and diet emphasized.  Lifestyle modification urged. Syncope and collapse: He has had no issues since January.  He has been evaluated by primary care and neurology.  I told him about driving instructions.  He will be cleared by his primary care to drive.  In general I mentioned to him that a person with syncope needs clearance by primary care to try albuterol evaluation. Dyspnea on exertion: Aortic atherosclerosis: I discussed this with him at length and in view of risk factors we will do exercise stress Cardiolite. Cardiac murmur echocardiogram will be done to assess murmur heard on auscultation. Cigarette smoker: I spent 5 minutes with the patient discussing solely about smoking. Smoking cessation was counseled. I suggested to the patient also different medications and pharmacological interventions. Patient is keen to try stopping on its own at this time. He will get back to me if he needs any further assistance in this matter. Patient will be seen in follow-up appointment in 2 months or earlier if the patient has any concerns    Medication Adjustments/Labs and Tests Ordered: Current  medicines are reviewed at length with the patient today.  Concerns regarding medicines are outlined above.  No orders of the defined types were placed in this encounter.  No orders of the defined types were placed in this encounter.    History of Present Illness:    Christian Peterson is a 52 y.o. male who is being seen today for the evaluation of syncope and dyspnea on exertion at the request of Christian Peterson, Christian Peterson, [place].  Patient is a pleasant 52 year old male.  He has past medical history of essential hypertension, aortic atherosclerosis and cigarette smoking.  He had a syncopal episode in January for which she was evaluated by emergency room, primary care and neurology colleagues.  Subsequently has felt fine.  Has not passed out.  He is an active gentleman.  No chest pain orthopnea or PND.  He has dyspnea on exertion.  At the time of my evaluation, the patient is alert awake oriented and in no distress.  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   Diverticulitis of colon with perforation 03/05/2022   Diverticulitis of intestine with perforation 03/05/2022   Elevated BP without diagnosis of hypertension 03/05/2022   GERD (gastroesophageal reflux disease)    occ with spicy foods    Heavy alcohol consumption 03/05/2022   Internal prolapsed hemorrhoids 03/05/2022   Tobacco abuse 03/05/2022   Uncontrolled hypertension 03/05/2022  Past Surgical History:  Procedure Laterality Date   ANKLE FRACTURE SURGERY     plate    WISDOM TOOTH EXTRACTION      Current Medications: Current Meds  Medication Sig   amLODipine (NORVASC) 10 MG tablet Take 1 tablet (10 mg total) by mouth daily.   famotidine (PEPCID) 20 MG tablet TAKE 1 TABLET BY MOUTH TWICE A DAY   losartan (COZAAR) 50 MG tablet Take 1 tablet (50 mg total) by mouth daily.     Allergies:   Patient has no known allergies.   Social History   Socioeconomic  History   Marital status: Married    Spouse name: Not on file   Number of children: Not on file   Years of education: Not on file   Highest education level: Not on file  Occupational History   Not on file  Tobacco Use   Smoking status: Every Day    Packs/day: 1.00    Types: Cigarettes   Smokeless tobacco: Never  Vaping Use   Vaping Use: Every day  Substance and Sexual Activity   Alcohol use: Yes    Comment: Drinks 5 out of 7 days.  Mostly beer.  Some liquor.   Drug use: Yes    Types: Marijuana    Comment: LAST SMOKED 08/11/20   Sexual activity: Yes  Other Topics Concern   Not on file  Social History Narrative   Are you right handed or left handed? Left handed    Are you currently employed ? no   What is your current occupation? na   Do you live at home alone?no   Who lives with you? Wife and pets   What type of home do you live in: 1 story or 2 story? 1 story 4 or 5 steps        Social Determinants of Health   Financial Resource Strain: Not on file  Food Insecurity: Not on file  Transportation Needs: Not on file  Physical Activity: Not on file  Stress: Not on file  Social Connections: Not on file     Family History: The patient's family history is not on file. He was adopted.  ROS:   Please see the history of present illness.    All other systems reviewed and are negative.  EKGs/Labs/Other Studies Reviewed:    The following studies were reviewed today: EKG reveals sinus rhythm and nonspecific ST changes   Recent Labs: 03/07/2022: ALT 35 03/08/2022: Magnesium 2.2 11/30/2022: BUN 13; Creatinine, Ser 0.73; Hemoglobin 16.4; Platelets 174; Potassium 4.0; Sodium 135  Recent Lipid Panel    Component Value Date/Time   CHOL 206 (H) 06/19/2020 1028   TRIG 139.0 06/19/2020 1028   HDL 59.10 06/19/2020 1028   CHOLHDL 3 06/19/2020 1028   VLDL 27.8 06/19/2020 1028   LDLCALC 120 (H) 06/19/2020 1028    Physical Exam:    VS:  BP (!) 160/96   Pulse (!) 104   Ht 5'  8" (1.727 m)   Wt 164 lb (74.4 kg)   SpO2 96%   BMI 24.94 kg/m     Wt Readings from Last 3 Encounters:  01/14/23 164 lb (74.4 kg)  01/05/23 162 lb 9.6 oz (73.8 kg)  12/16/22 158 lb (71.7 kg)     GEN: Patient is in no acute distress HEENT: Normal NECK: No JVD; No carotid bruits LYMPHATICS: No lymphadenopathy CARDIAC: S1 S2 regular, 2/6 systolic murmur at the apex. RESPIRATORY:  Clear to auscultation without rales, wheezing or  rhonchi  ABDOMEN: Soft, non-tender, non-distended MUSCULOSKELETAL:  No edema; No deformity  SKIN: Warm and dry NEUROLOGIC:  Alert and oriented x 3 PSYCHIATRIC:  Normal affect    Signed, Christian Lindau, MD  01/14/2023 3:08 PM    [place]

## 2023-01-14 NOTE — Patient Instructions (Addendum)
Please keep a BP log for 2 weeks and send by MyChart or mail.  Blood Pressure Record Sheet To take your blood pressure, you will need a blood pressure machine. You can buy a blood pressure machine (blood pressure monitor) at your clinic, drug store, or online. When choosing one, consider: An automatic monitor that has an arm cuff. A cuff that wraps snugly around your upper arm. You should be able to fit only one finger between your arm and the cuff. A device that stores blood pressure reading results. Do not choose a monitor that measures your blood pressure from your wrist or finger. Follow your health care provider's instructions for how to take your blood pressure. To use this form: Get one reading in the morning (a.m.) 1-2 hours after you take any medicines. Get one reading in the evening (p.m.) before supper.   Blood pressure log Date: _______________________  a.m. _____________________(1st reading) HR___________            p.m. _____________________(2nd reading) HR__________  Date: _______________________  a.m. _____________________(1st reading) HR___________            p.m. _____________________(2nd reading) HR__________  Date: _______________________  a.m. _____________________(1st reading) HR___________            p.m. _____________________(2nd reading) HR__________  Date: _______________________  a.m. _____________________(1st reading) HR___________            p.m. _____________________(2nd reading) HR__________  Date: _______________________  a.m. _____________________(1st reading) HR___________            p.m. _____________________(2nd reading) HR__________  Date: _______________________  a.m. _____________________(1st reading) HR___________            p.m. _____________________(2nd reading) HR__________  Date: _______________________  a.m. _____________________(1st reading) HR___________            p.m. _____________________(2nd reading)  HR__________   This information is not intended to replace advice given to you by your health care provider. Make sure you discuss any questions you have with your health care provider. Document Revised: 02/21/2020 Document Reviewed: 02/21/2020 Elsevier Patient Education  [place].       Medication Instructions:  Your physician has recommended you make the following change in your medication:   Stop Amlodipine  Increase your Losartan to 100 mg daily.  Start Toprol XL 50 mg daily.  *If you need a refill on your cardiac medications before your next appointment, please call your pharmacy*   Lab Work: Your physician recommends that you return for lab work in: the next few days for CMP and lipids. You need to have labs done when you are fasting. [facility] lab is located on the 3rd floor, [address]. Hours are Monday - Friday 8 am to 4 pm, closed 11:30 am to 1:00 pm. You do NOT need an appointment.    If you have labs (blood work) drawn today and your tests are completely normal, you will receive your results only by: [place] (if you have MyChart) OR A paper copy in the mail If you have any lab test that is abnormal or we need to change your treatment, we will call you to review the results.   Testing/Procedures: You are scheduled for a Myocardial Perfusion Imaging Study.  Please arrive 15 minutes prior to your appointment time for registration and insurance purposes.  The test will take approximately 3 to 4 hours to complete; you may bring reading material.  If someone comes with you to your appointment, they will need to remain in the  main lobby due to limited space in the testing area.   How to prepare for your Myocardial Perfusion Test: Do not eat or drink 3 hours prior to your test, except you may have water. Do not consume products containing caffeine (regular or decaffeinated) 12 hours prior to your test. (ex: coffee, chocolate, sodas, tea). Do bring a list of  your current medications with you.  If not listed below, you may take your medications as normal. Do not take metoprolol (Lopressor, Toprol) for 24 hours prior to the test.  Bring the medication to your appointment as you may be required to take it once the test is complete. Do wear comfortable clothes (no dresses or overalls) and walking shoes, tennis shoes preferred (No heels or open toe shoes are allowed). Do NOT wear cologne, perfume, aftershave, or lotions (deodorant is allowed). If these instructions are not followed, your test will have to be rescheduled.  If you cannot keep your appointment, please provide 24 hours notification to the Nuclear Lab, to avoid a possible $50 charge to your account.   Your physician has requested that you have an echocardiogram. Echocardiography is a painless test that uses sound waves to create images of your heart. It provides your doctor with information about the size and shape of your heart and how well your heart's chambers and valves are working. This procedure takes approximately one hour. There are no restrictions for this procedure. Please do NOT wear cologne, perfume, aftershave, or lotions (deodorant is allowed). Please arrive 15 minutes prior to your appointment time.   Follow-Up: At [facility], you and your health needs are our priority.  As part of our continuing mission to provide you with exceptional heart care, we have created designated Provider Care Teams.  These Care Teams include your primary Cardiologist (physician) and Advanced Practice Providers (APPs -  Physician Assistants and Nurse Practitioners) who all work together to provide you with the care you need, when you need it.  We recommend signing up for the patient portal called "MyChart".  Sign up information is provided on this After Visit Summary.  MyChart is used to connect with patients for Virtual Visits (Telemedicine).  Patients are able to view lab/test results, encounter  notes, upcoming appointments, etc.  Non-urgent messages can be sent to your provider as well.   To learn more about what you can do with MyChart, go to [email or web address].    Your next appointment:   2 month(s)  The format for your next appointment:   In Person  Provider:   Jyl Heinz, MD   Other Instructions Cardiac Nuclear Scan A cardiac nuclear scan is a test that is done to check the flow of blood to your heart. It is done when you are resting and when you are exercising. The test looks for problems such as: Not enough blood reaching a portion of the heart. The heart muscle not working as it should. You may need this test if you have: Heart disease. Lab results that are not normal. Had heart surgery or a balloon procedure to open up blocked arteries (angioplasty) or a small mesh tube (stent). Chest pain. Shortness of breath. Had a heart attack. In this test, a special dye (tracer) is put into your bloodstream. The tracer will travel to your heart. A camera will then take pictures of your heart to see how the tracer moves through your heart. This test is usually done at a hospital and takes 2-4 hours. Tell a doctor about:  Any allergies you have. All medicines you are taking, including vitamins, herbs, eye drops, creams, and over-the-counter medicines. Any bleeding problems you have. Any surgeries you have had. Any medical conditions you have. Whether you are pregnant or may be pregnant. Any history of asthma or long-term (chronic) lung disease. Any history of heart rhythm disorders or heart valve conditions. What are the risks? Your doctor will talk with you about risks. These may include: Serious chest pain and heart attack. This is only a risk if the stress portion of the test is done. Fast or uneven heartbeats (palpitations). A feeling of warmth in your chest. This feeling usually does not last long. Allergic reaction to the tracer. Shortness of breath or  trouble breathing. What happens before the test? Ask your doctor about changing or stopping your normal medicines. Follow instructions from your doctor about what you cannot eat or drink. Remove your jewelry on the day of the test. Ask your doctor if you need to avoid nicotine or caffeine. What happens during the test? An IV tube will be inserted into one of your veins. Your doctor will give you a small amount of tracer through the IV tube. You will wait for 20-40 minutes while the tracer moves through your bloodstream. Your heart will be monitored with an electrocardiogram (ECG). You will lie down on an exam table. Pictures of your heart will be taken for about 15-20 minutes. You may also have a stress test. For this test, one of these things may be done: You will be asked to exercise on a treadmill or a stationary bike. You will be given medicines that will make your heart work harder. This is done if you are unable to exercise. When blood flow to your heart has peaked, a tracer will again be given through the IV tube. After 20-40 minutes, you will get back on the exam table. More pictures will be taken of your heart. Depending on the tracer that is used, more pictures may need to be taken 3-4 hours later. Your IV tube will be removed when the test is over. The test may vary among doctors and hospitals. What happens after the test? Ask your doctor: Whether you can return to your normal schedule, including diet, activities, travel, and medicines. Whether you should drink more fluids. This will help to remove the tracer from your body. Ask your doctor, or the department that is doing the test: When will my results be ready? How will I get my results? What are my treatment options? What other tests do I need? What are my next steps? This information is not intended to replace advice given to you by your health care provider. Make sure you discuss any questions you have with your health  care provider. Document Revised: 03/31/2022 Document Reviewed: 03/31/2022 Elsevier Patient Education  [place].  Echocardiogram An echocardiogram is a test that uses sound waves (ultrasound) to produce images of the heart. Images from an echocardiogram can provide important information about: Heart size and shape. The size and thickness and movement of your heart's walls. Heart muscle function and strength. Heart valve function or if you have stenosis. Stenosis is when the heart valves are too narrow. If blood is flowing backward through the heart valves (regurgitation). A tumor or infectious growth around the heart valves. Areas of heart muscle that are not working well because of poor blood flow or injury from a heart attack. Aneurysm detection. An aneurysm is a weak or damaged part  of an artery wall. The wall bulges out from the normal force of blood pumping through the body. Tell a health care provider about: Any allergies you have. All medicines you are taking, including vitamins, herbs, eye drops, creams, and over-the-counter medicines. Any blood disorders you have. Any surgeries you have had. Any medical conditions you have. Whether you are pregnant or may be pregnant. What are the risks? Generally, this is a safe test. However, problems may occur, including an allergic reaction to dye (contrast) that may be used during the test. What happens before the test? No specific preparation is needed. You may eat and drink normally. What happens during the test?  You will take off your clothes from the waist up and put on a hospital gown. Electrodes or electrocardiogram (ECG)patches may be placed on your chest. The electrodes or patches are then connected to a device that monitors your heart rate and rhythm. You will lie down on a table for an ultrasound exam. A gel will be applied to your chest to help sound waves pass through your skin. A handheld device, called a  transducer, will be pressed against your chest and moved over your heart. The transducer produces sound waves that travel to your heart and bounce back (or "echo" back) to the transducer. These sound waves will be captured in real-time and changed into images of your heart that can be viewed on a video monitor. The images will be recorded on a computer and reviewed by your health care provider. You may be asked to change positions or hold your breath for a short time. This makes it easier to get different views or better views of your heart. In some cases, you may receive contrast through an IV in one of your veins. This can improve the quality of the pictures from your heart. The procedure may vary among health care providers and hospitals. What can I expect after the test? You may return to your normal, everyday life, including diet, activities, and medicines, unless your health care provider tells you not to do that. Follow these instructions at home: It is up to you to get the results of your test. Ask your health care provider, or the department that is doing the test, when your results will be ready. Keep all follow-up visits. This is important. Summary An echocardiogram is a test that uses sound waves (ultrasound) to produce images of the heart. Images from an echocardiogram can provide important information about the size and shape of your heart, heart muscle function, heart valve function, and other possible heart problems. You do not need to do anything to prepare before this test. You may eat and drink normally. After the echocardiogram is completed, you may return to your normal, everyday life, unless your health care provider tells you not to do that. This information is not intended to replace advice given to you by your health care provider. Make sure you discuss any questions you have with your health care provider. Document Revised: 07/16/2021 Document Reviewed: 06/25/2020 Elsevier  Patient Education  [place].

## 2023-01-28 ENCOUNTER — Telehealth (HOSPITAL_COMMUNITY): Payer: Self-pay | Admitting: *Deleted

## 2023-01-28 NOTE — Telephone Encounter (Signed)
Left message on voicemail per DPR in reference to upcoming appointment scheduled on 02/04/2023 at 12:45 with detailed instructions given per Myocardial Perfusion Study Information Sheet for the test. LM to arrive 15 minutes early, and that it is imperative to arrive on time for appointment to keep from having the test rescheduled. If you need to cancel or reschedule your appointment, please call the office within 24 hours of your appointment. Failure to do so may result in a cancellation of your appointment, and a $50 no show fee. Phone number given for call back for any questions.

## 2023-01-29 ENCOUNTER — Ambulatory Visit (HOSPITAL_BASED_OUTPATIENT_CLINIC_OR_DEPARTMENT_OTHER)
Admission: RE | Admit: 2023-01-29 | Discharge: 2023-01-29 | Disposition: A | Discharge status: Home / Self Care | Payer: Managed Care, Other (non HMO) | Source: Ambulatory Visit | Attending: Cardiology | Admitting: Cardiology

## 2023-01-29 DIAGNOSIS — I5030 Unspecified diastolic (congestive) heart failure: Secondary | ICD-10-CM

## 2023-01-29 DIAGNOSIS — R55 Syncope and collapse: Secondary | ICD-10-CM | POA: Diagnosis present

## 2023-01-29 DIAGNOSIS — R0609 Other forms of dyspnea: Secondary | ICD-10-CM

## 2023-01-29 LAB — ECHOCARDIOGRAM COMPLETE
Area-P 1/2: 4.86 cm2
S' Lateral: 3.1 cm

## 2023-02-04 ENCOUNTER — Ambulatory Visit (HOSPITAL_COMMUNITY): Payer: Managed Care, Other (non HMO)

## 2023-02-04 ENCOUNTER — Encounter (HOSPITAL_COMMUNITY): Payer: Managed Care, Other (non HMO)

## 2023-02-04 ENCOUNTER — Telehealth: Payer: Self-pay | Admitting: Medical

## 2023-02-04 ENCOUNTER — Encounter (HOSPITAL_COMMUNITY): Payer: Self-pay

## 2023-02-04 NOTE — Telephone Encounter (Signed)
Please advise 

## 2023-02-04 NOTE — Telephone Encounter (Signed)
Patient said that he believes he is having a diverticulitis flare up and the last time he saw Percell Miller for this, he said Percell Miller told him to call in and he would give him an antibiotic. Pt uses [facility] in [facility]. Please call to advise.

## 2023-02-05 ENCOUNTER — Emergency Department (HOSPITAL_BASED_OUTPATIENT_CLINIC_OR_DEPARTMENT_OTHER): Payer: Managed Care, Other (non HMO)

## 2023-02-05 ENCOUNTER — Encounter (HOSPITAL_BASED_OUTPATIENT_CLINIC_OR_DEPARTMENT_OTHER): Payer: Self-pay | Admitting: Emergency Medicine

## 2023-02-05 ENCOUNTER — Emergency Department (HOSPITAL_BASED_OUTPATIENT_CLINIC_OR_DEPARTMENT_OTHER)
Admission: EM | Admit: 2023-02-05 | Discharge: 2023-02-05 | Disposition: A | Discharge status: Home / Self Care | Payer: Managed Care, Other (non HMO) | Attending: Emergency Medicine | Admitting: Emergency Medicine

## 2023-02-05 DIAGNOSIS — K5732 Diverticulitis of large intestine without perforation or abscess without bleeding: Secondary | ICD-10-CM | POA: Insufficient documentation

## 2023-02-05 DIAGNOSIS — D72829 Elevated white blood cell count, unspecified: Secondary | ICD-10-CM | POA: Insufficient documentation

## 2023-02-05 DIAGNOSIS — I10 Essential (primary) hypertension: Secondary | ICD-10-CM | POA: Diagnosis not present

## 2023-02-05 DIAGNOSIS — R1032 Left lower quadrant pain: Secondary | ICD-10-CM | POA: Diagnosis present

## 2023-02-05 LAB — CBC WITH DIFFERENTIAL/PLATELET
Abs Immature Granulocytes: 0.05 10*3/uL (ref 0.00–0.07)
Basophils Absolute: 0.1 10*3/uL (ref 0.0–0.1)
Basophils Relative: 1 %
Eosinophils Absolute: 0.1 10*3/uL (ref 0.0–0.5)
Eosinophils Relative: 1 %
HCT: 50.9 % (ref 39.0–52.0)
Hemoglobin: 17.7 g/dL — ABNORMAL HIGH (ref 13.0–17.0)
Immature Granulocytes: 0 %
Lymphocytes Relative: 10 %
Lymphs Abs: 1.2 10*3/uL (ref 0.7–4.0)
MCH: 33.3 pg (ref 26.0–34.0)
MCHC: 34.8 g/dL (ref 30.0–36.0)
MCV: 95.7 fL (ref 80.0–100.0)
Monocytes Absolute: 1.3 10*3/uL — ABNORMAL HIGH (ref 0.1–1.0)
Monocytes Relative: 11 %
Neutro Abs: 9 10*3/uL — ABNORMAL HIGH (ref 1.7–7.7)
Neutrophils Relative %: 77 %
Platelets: 206 10*3/uL (ref 150–400)
RBC: 5.32 MIL/uL (ref 4.22–5.81)
RDW: 11.9 % (ref 11.5–15.5)
WBC: 11.7 10*3/uL — ABNORMAL HIGH (ref 4.0–10.5)
nRBC: 0 % (ref 0.0–0.2)

## 2023-02-05 LAB — COMPREHENSIVE METABOLIC PANEL
ALT: 52 U/L — ABNORMAL HIGH (ref 0–44)
AST: 52 U/L — ABNORMAL HIGH (ref 15–41)
Albumin: 4.2 g/dL (ref 3.5–5.0)
Alkaline Phosphatase: 90 U/L (ref 38–126)
Anion gap: 11 (ref 5–15)
BUN: 11 mg/dL (ref 6–20)
CO2: 24 mmol/L (ref 22–32)
Calcium: 9.3 mg/dL (ref 8.9–10.3)
Chloride: 96 mmol/L — ABNORMAL LOW (ref 98–111)
Creatinine, Ser: 0.69 mg/dL (ref 0.61–1.24)
GFR, Estimated: >60 mL/min (ref >60)
Glucose, Bld: 104 mg/dL — ABNORMAL HIGH (ref 70–99)
Potassium: 3.7 mmol/L (ref 3.5–5.1)
Sodium: 131 mmol/L — ABNORMAL LOW (ref 135–145)
Total Bilirubin: 0.9 mg/dL (ref 0.3–1.2)
Total Protein: 8 g/dL (ref 6.5–8.1)

## 2023-02-05 LAB — LIPASE, BLOOD: Lipase: 39 U/L (ref 11–51)

## 2023-02-05 MED ORDER — AMOXICILLIN-POT CLAVULANATE 875-125 MG PO TABS: 1.0000 | ORAL_TABLET | Freq: Two times a day (BID) | ORAL | 0 refills | Status: AC

## 2023-02-05 MED ORDER — ONDANSETRON HCL 4 MG/2ML IJ SOLN
4.0000 mg | Freq: Once | INTRAMUSCULAR | Status: AC
  Administered 2023-02-05: 4 mg via INTRAVENOUS
  Filled 2023-02-05: qty 2

## 2023-02-05 MED ORDER — ONDANSETRON 4 MG PO TBDP: 4.0000 mg | ORAL_TABLET | Freq: Three times a day (TID) | ORAL | 0 refills | Status: DC | PRN

## 2023-02-05 MED ORDER — HYDROCODONE-ACETAMINOPHEN 5-325 MG PO TABS: 1.0000 | ORAL_TABLET | Freq: Four times a day (QID) | ORAL | 0 refills | Status: DC | PRN

## 2023-02-05 MED ORDER — MORPHINE SULFATE (PF) 4 MG/ML IV SOLN
4.0000 mg | Freq: Once | INTRAVENOUS | Status: AC
  Administered 2023-02-05: 4 mg via INTRAVENOUS
  Filled 2023-02-05: qty 1

## 2023-02-05 MED ORDER — ALUM & MAG HYDROXIDE-SIMETH 200-200-20 MG/5ML PO SUSP
15.0000 mL | Freq: Once | ORAL | Status: AC
  Administered 2023-02-05: 15 mL via ORAL
  Filled 2023-02-05: qty 30

## 2023-02-05 MED ORDER — SENNOSIDES-DOCUSATE SODIUM 8.6-50 MG PO TABS: 1.0000 | ORAL_TABLET | Freq: Every evening | ORAL | 0 refills | Status: DC | PRN

## 2023-02-05 MED ORDER — IOHEXOL 300 MG/ML  SOLN
100.0000 mL | Freq: Once | INTRAMUSCULAR | Status: AC | PRN
  Administered 2023-02-05: 100 mL via INTRAVENOUS

## 2023-02-05 MED ORDER — SODIUM CHLORIDE 0.9 % IV BOLUS
500.0000 mL | Freq: Once | INTRAVENOUS | Status: AC
  Administered 2023-02-05: 500 mL via INTRAVENOUS

## 2023-02-05 NOTE — ED Provider Notes (Signed)
Emergency Department Provider Note   I have reviewed the triage vital signs and the nursing notes.   HISTORY  Chief Complaint Abdominal Pain   HPI Christian Peterson is a 52 y.o. male with prior history of diverticulitis presents emergency department with left lower abdominal pain.  No fevers or vomiting.  He has had similar pain in the past which developed into complication requiring prolonged recovery and antibiotics.  When he felt the symptoms early, he called his PCP, who directed him here for evaluation and likely CT imaging. No CP.    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 Respiratory: Denies shortness of breath. Gastrointestinal: Positive abdominal pain.  No nausea, no vomiting.  No diarrhea.  No constipation. Genitourinary: Negative for dysuria. Musculoskeletal: Negative for back pain. Skin: Negative for rash. Neurological: Negative for headaches.   ____________________________________________   PHYSICAL EXAM:  VITAL SIGNS: ED Triage Vitals [02/05/23 1233]  Enc Vitals Group     BP (!) 144/110     Pulse Rate (!) 111     Resp 18     Temp 98.2 F (36.8 C)     Temp Source Oral     SpO2 98 %   Constitutional: Alert and oriented. Well appearing and in no acute distress. Eyes: Conjunctivae are normal.  Head: Atraumatic. Nose: No congestion/rhinnorhea. Mouth/Throat: Mucous membranes are moist.   Neck: No  stridor.   Cardiovascular: Normal rate, regular rhythm. Good peripheral circulation. Grossly normal heart sounds.   Respiratory: Normal respiratory effort.  No retractions. Lungs CTAB. Gastrointestinal: Soft with tenderness in the LLQ. No focal peritonitis. No distention.  Musculoskeletal: No lower extremity tenderness nor edema. No gross deformities of extremities. Neurologic:  Normal speech and language. No gross focal neurologic deficits are appreciated.  Skin:  Skin is warm, dry and intact. No rash noted.  ____________________________________________   LABS (all labs ordered are listed, but only abnormal results are displayed)  Labs Reviewed  COMPREHENSIVE METABOLIC PANEL - Abnormal; Notable for the following components:      Result Value   Sodium 131 (*)    Chloride 96 (*)    Glucose, Bld 104 (*)    AST 52 (*)    ALT 52 (*)    All other components within normal limits  CBC WITH DIFFERENTIAL/PLATELET - Abnormal; Notable for the following components:   WBC 11.7 (*)    Hemoglobin 17.7 (*)    Neutro Abs 9.0 (*)    Monocytes Absolute 1.3 (*)    All other components within normal limits  LIPASE, BLOOD   ____________________________________________  RADIOLOGY  CT ABDOMEN PELVIS W CONTRAST  Result Date: 02/05/2023 CLINICAL DATA:  Abdominal pain, history of diverticulitis EXAM: CT ABDOMEN AND PELVIS WITH CONTRAST TECHNIQUE: Multidetector CT imaging of the abdomen and pelvis was performed using the standard protocol following bolus administration of intravenous contrast. RADIATION DOSE REDUCTION: This exam was performed according to the departmental dose-optimization program which includes automated  exposure control, adjustment of the mA and/or kV according to patient size and/or use of iterative reconstruction technique. CONTRAST:  178mL OMNIPAQUE IOHEXOL 300 MG/ML  SOLN COMPARISON:  None Available. FINDINGS: Lower chest: Lung bases are clear. Hepatobiliary: No focal hepatic lesion.  Normal gallbladder. No biliary duct dilatation. Common bile duct is normal. Pancreas: Pancreas is normal. No ductal dilatation. No pancreatic inflammation. Spleen: Normal spleen Adrenals/urinary tract: Adrenal glands and kidneys are normal. The ureters and bladder normal. Stomach/Bowel: Stomach, small bowel, appendix, and cecum are normal. Ascending and transverse colon normal. Descending colon normal. Beginning in the proximal sigmoid colon there is pericolonic fat stranding adjacent to multiple diverticula. No perforation or abscess. This pericolonic stranding and mild bowel wall inflammation ax occurs over approximately 10 cm segment (image 62/2) More distal sigmoid colon rectum normal. No adenopathy in the mesentery.  No free fluid. Vascular/Lymphatic: Abdominal aorta is normal caliber. No periportal or retroperitoneal adenopathy. No pelvic adenopathy. Reproductive: Prostate unremarkable Other: No free fluid. Musculoskeletal: No aggressive osseous lesion. IMPRESSION: Acute diverticulitis of the proximal sigmoid colon. No perforation or abscess. Electronically Signed   By: Suzy Bouchard M.D.   On: 02/05/2023 15:36    ____________________________________________   PROCEDURES  Procedure(s) performed:   Procedures  None  ____________________________________________   INITIAL IMPRESSION / ASSESSMENT AND PLAN / ED COURSE  Pertinent labs & imaging results that were available during my care of the patient were reviewed by me and considered in my medical decision making (see chart for details).   This patient is Presenting for Evaluation of abdominal pain, which does require a range of treatment options, and is a complaint that involves a high risk of morbidity and mortality.  The Differential Diagnoses includes but is not exclusive to acute appendicitis, renal colic, testicular torsion, urinary tract infection, prostatitis,  diverticulitis, small bowel obstruction, colitis, abdominal aortic  aneurysm, gastroenteritis, constipation etc.   Critical Interventions-    Medications  sodium chloride 0.9 % bolus 500 mL (0 mLs Intravenous Stopped 02/05/23 1532)  ondansetron (ZOFRAN) injection 4 mg (4 mg Intravenous Given 02/05/23 1412)  morphine (PF) 4 MG/ML injection 4 mg (4 mg Intravenous Given 02/05/23 1413)  iohexol (OMNIPAQUE) 300 MG/ML solution 100 mL (100 mLs Intravenous Contrast Given 02/05/23 1458)  alum & mag hydroxide-simeth (MAALOX/MYLANTA) 200-200-20 MG/5ML suspension 15 mL (15 mLs Oral Given 02/05/23 1530)    Reassessment after intervention: Pain improved.    I decided to review pertinent External Data, and in summary patient with prior admit for complicated diverticulitis in the past.   Clinical Laboratory Tests Ordered, included CBC with mild leukocytosis of 11.7.  No acute kidney injury.  LFTs mildly elevated at 52 with normal bilirubin and lipase normal.  Radiologic Tests Ordered, included CT abdomen/pelvis. I independently interpreted the images and agree with radiology interpretation.   Cardiac Monitor Tracing which shows NSR.    Social Determinants of Health Risk patient is a smoker.   Medical Decision Making: Summary:  Patient presents emergency department for evaluation of abdominal pain similar to diverticulitis in the past.  Plan for labs, CT imaging, pain management here.  Reevaluation with update and discussion with patient.  Reviewed CT imaging showing uncomplicated diverticulitis.  Plan for Augmentin.  Pain is well-controlled.  He is tolerating oral fluids.   Considered admission but uncomplicated diverticulitis noted on CT.  Patient is feeling well.  He has an established relationship with gastroenterology and will contact them regarding his most recent diagnosis and ED visit.  Patient's presentation  is most consistent with acute presentation with potential threat to life or bodily function.   Disposition:  discharge  ____________________________________________  FINAL CLINICAL IMPRESSION(S) / ED DIAGNOSES  Final diagnoses:  Diverticulitis of large intestine without perforation or abscess without bleeding     NEW OUTPATIENT MEDICATIONS STARTED DURING THIS VISIT:  Discharge Medication List as of 02/05/2023  3:47 PM     START taking these medications   Details  amoxicillin-clavulanate (AUGMENTIN) 875-125 MG tablet Take 1 tablet by mouth every 12 (twelve) hours for 10 days., Starting Fri 02/05/2023, Until Mon 02/15/2023, Normal    HYDROcodone-acetaminophen (NORCO/VICODIN) 5-325 MG tablet Take 1 tablet by mouth every 6 (six) hours as needed for severe pain., Starting Fri 02/05/2023, Normal    ondansetron (ZOFRAN-ODT) 4 MG disintegrating tablet Take 1 tablet (4 mg total) by mouth every 8 (eight) hours as needed., Starting Fri 02/05/2023, Normal    senna-docusate (SENOKOT-S) 8.6-50 MG tablet Take 1 tablet by mouth at bedtime as needed for mild constipation or moderate constipation., Starting Fri 02/05/2023, Normal        Note:  This document was prepared using Dragon voice recognition software and may include unintentional dictation errors.  Nanda Quinton, MD, [facility] Emergency [facility]    Nazifa Trinka, [facility], MD 02/06/23 [phone]

## 2023-02-05 NOTE — ED Notes (Signed)
Report rec'd from previous RN 

## 2023-02-05 NOTE — ED Triage Notes (Signed)
Pt hx of diverticulitis in past. States stomach feeling that way again. He called pcp yesterday and today and they wanted him to come in for scan to r/o perf.

## 2023-02-05 NOTE — Telephone Encounter (Signed)
Pt stated he is having sharp pains in Lower abdomen no particular area-- pain level when flaring up is 7/10 ... Diarrhea , pt stated he hasn't been eating much since Sunday night. 02/04/23 flare up started and he stated its the same identical pain as last time.   [facility]    Pt stated he didn't know he was supposed to have a specialist appt , but has seen cardiology.

## 2023-02-05 NOTE — Telephone Encounter (Signed)
Patient advised that he needs to go to ER immediately to be evaluated due to his pain level.  Advised that medication at this point may not help as we are worried about perforation.  He agreed to go to ER to be evaluated.

## 2023-02-05 NOTE — ED Notes (Signed)
Pt returned from CT, c/o of heartburn, MD notified.

## 2023-02-05 NOTE — Telephone Encounter (Signed)
Pt followed up to advise he is in a lot of pain and would like to know if Percell Miller could send in his medication. Pt wants to know if he could take hydrocodone for pain. Pt wants to know if someone can remind him to call this in as soon as possible.

## 2023-02-05 NOTE — Discharge Instructions (Signed)
We believe your symptoms are caused by diverticulitis.  Most of the time this condition (please read through the included information) can be cured with outpatient antibiotics.  Please take the full course of prescribed medication(s) and follow up with the doctors recommended above. ° °Return to the ED if your abdominal pain worsens or fails to improve, you develop bloody vomiting, bloody diarrhea, you are unable to tolerate fluids due to vomiting, fever greater than 101, or other symptoms that concern you. ° °Take Vicodin as prescribed for severe pain. Do not drink alcohol, drive or participate in any other potentially dangerous activities while taking this medication as it may make you sleepy. Do not take this medication with any other sedating medications, either prescription or over-the-counter. If you were prescribed Percocet or Vicodin, do not take these with acetaminophen (Tylenol) as it is already contained within these medications. °  °This medication is an opiate (or narcotic) pain medication and can be habit forming.  Use it as little as possible to achieve adequate pain control.  Do not use or use it with extreme caution if you have a history of opiate abuse or dependence. This medication is intended for your use only - do not give any to anyone else and keep it in a secure place where nobody else, especially children, have access to it.  It will also cause or worsen constipation, so you may want to consider taking an over-the-counter stool softener while you are taking this medication. ° ° ° ° °

## 2023-02-11 ENCOUNTER — Telehealth: Payer: Self-pay

## 2023-02-11 ENCOUNTER — Ambulatory Visit (HOSPITAL_COMMUNITY): Payer: Managed Care, Other (non HMO) | Attending: Cardiology

## 2023-02-11 DIAGNOSIS — R55 Syncope and collapse: Secondary | ICD-10-CM | POA: Diagnosis present

## 2023-02-11 DIAGNOSIS — R0609 Other forms of dyspnea: Secondary | ICD-10-CM | POA: Insufficient documentation

## 2023-02-11 LAB — MYOCARDIAL PERFUSION IMAGING
LV dias vol: 105 mL (ref 62–150)
LV sys vol: 56 mL
Nuc Stress EF: 47 %
Peak HR: 139 {beats}/min
Rest HR: 93 {beats}/min
Rest Nuclear Isotope Dose: 10.2 mCi
SDS: 1
SRS: 1
SSS: 2
ST Depression (mm): 0 mm
Stress Nuclear Isotope Dose: 32.6 mCi
TID: 1.04

## 2023-02-11 MED ORDER — REGADENOSON 0.4 MG/5ML IV SOLN
0.4000 mg | Freq: Once | INTRAVENOUS | Status: AC
  Administered 2023-02-11: 0.4 mg via INTRAVENOUS

## 2023-02-11 MED ORDER — TECHNETIUM TC 99M TETROFOSMIN IV KIT
10.2000 | PACK | Freq: Once | INTRAVENOUS | Status: AC | PRN
  Administered 2023-02-11: 10.2 via INTRAVENOUS

## 2023-02-11 MED ORDER — TECHNETIUM TC 99M TETROFOSMIN IV KIT
32.6000 | PACK | Freq: Once | INTRAVENOUS | Status: AC | PRN
  Administered 2023-02-11: 32.6 via INTRAVENOUS

## 2023-02-11 NOTE — Telephone Encounter (Signed)
Left vm to call back

## 2023-02-11 NOTE — Telephone Encounter (Signed)
-----   Message from Jenean Lindau, MD sent at 02/11/2023  3:43 PM EDT ----- The results of the study is unremarkable. Please inform patient. I will discuss in detail at next appointment. Cc  primary care/referring physician Jenean Lindau, MD 02/11/2023 3:43 PM

## 2023-03-23 ENCOUNTER — Ambulatory Visit: Payer: Managed Care, Other (non HMO) | Attending: Cardiology | Admitting: Cardiology

## 2023-03-31 ENCOUNTER — Telehealth: Payer: Self-pay | Admitting: Medical

## 2023-03-31 ENCOUNTER — Encounter: Payer: Self-pay | Admitting: Medical

## 2023-03-31 NOTE — Telephone Encounter (Signed)
Pt called stating that he has had a diverticulitis flare up and was wondering if antibiotics could be called into his pharmacy. Pt would like a call to indicate when this has been done.

## 2023-04-01 MED ORDER — CIPROFLOXACIN HCL 500 MG PO TABS: 500.0000 mg | ORAL_TABLET | Freq: Two times a day (BID) | ORAL | 0 refills | Status: DC

## 2023-04-01 MED ORDER — METRONIDAZOLE 500 MG PO TABS: 500.0000 mg | ORAL_TABLET | Freq: Three times a day (TID) | ORAL | 0 refills | Status: AC

## 2023-04-01 NOTE — Telephone Encounter (Signed)
Pt notified via mychart

## 2023-04-01 NOTE — Telephone Encounter (Signed)
Spoke with PCP verbally , stated he will send in medication

## 2023-04-01 NOTE — Addendum Note (Signed)
Addended by: Gwenevere Abbot on: 04/01/2023 10:06 AM   Modules accepted: Orders

## 2023-04-01 NOTE — Telephone Encounter (Signed)
Appt scheduled for 04/06/23

## 2023-04-06 ENCOUNTER — Ambulatory Visit: Payer: Managed Care, Other (non HMO) | Admitting: Medical

## 2023-04-06 ENCOUNTER — Encounter: Payer: Self-pay | Admitting: Medical

## 2023-04-06 VITALS — BP 145/80 | HR 98 | Resp 18 | Ht 68.0 in | Wt 162.4 lb

## 2023-04-06 DIAGNOSIS — I10 Essential (primary) hypertension: Secondary | ICD-10-CM

## 2023-04-06 DIAGNOSIS — K5730 Diverticulosis of large intestine without perforation or abscess without bleeding: Secondary | ICD-10-CM | POA: Diagnosis not present

## 2023-04-06 DIAGNOSIS — R1032 Left lower quadrant pain: Secondary | ICD-10-CM

## 2023-04-06 LAB — CBC WITH DIFFERENTIAL/PLATELET
Basophils Absolute: 0.1 10*3/uL (ref 0.0–0.1)
Basophils Relative: 0.7 % (ref 0.0–3.0)
Eosinophils Absolute: 0.1 10*3/uL (ref 0.0–0.7)
Eosinophils Relative: 1.1 % (ref 0.0–5.0)
HCT: 50.5 % (ref 39.0–52.0)
Hemoglobin: 16.9 g/dL (ref 13.0–17.0)
Lymphocytes Relative: 11.8 % — ABNORMAL LOW (ref 12.0–46.0)
Lymphs Abs: 1 10*3/uL (ref 0.7–4.0)
MCHC: 33.4 g/dL (ref 30.0–36.0)
MCV: 98.7 fl (ref 78.0–100.0)
Monocytes Absolute: 0.8 10*3/uL (ref 0.1–1.0)
Monocytes Relative: 9.9 % (ref 3.0–12.0)
Neutro Abs: 6.4 10*3/uL (ref 1.4–7.7)
Neutrophils Relative %: 76.5 % (ref 43.0–77.0)
Platelets: 204 10*3/uL (ref 150.0–400.0)
RBC: 5.12 Mil/uL (ref 4.22–5.81)
RDW: 12.9 % (ref 11.5–15.5)
WBC: 8.3 10*3/uL (ref 4.0–10.5)

## 2023-04-06 LAB — COMPREHENSIVE METABOLIC PANEL
ALT: 76 U/L — ABNORMAL HIGH (ref 0–53)
AST: 92 U/L — ABNORMAL HIGH (ref 0–37)
Albumin: 4.3 g/dL (ref 3.5–5.2)
Alkaline Phosphatase: 72 U/L (ref 39–117)
BUN: 13 mg/dL (ref 6–23)
CO2: 28 mEq/L (ref 19–32)
Calcium: 9.5 mg/dL (ref 8.4–10.5)
Chloride: 98 mEq/L (ref 96–112)
Creatinine, Ser: 0.64 mg/dL (ref 0.40–1.50)
GFR: 107.92 mL/min (ref >60.00)
Glucose, Bld: 84 mg/dL (ref 70–99)
Potassium: 4.3 mEq/L (ref 3.5–5.1)
Sodium: 139 mEq/L (ref 135–145)
Total Bilirubin: 0.5 mg/dL (ref 0.2–1.2)
Total Protein: 7.4 g/dL (ref 6.0–8.3)

## 2023-04-06 NOTE — Progress Notes (Signed)
Subjective:    Patient ID: SHY BRUSH, male    DOB: [birth year], 52 y.o.   MRN: [id number]  HPI Pt in for follow up.   Pt states Thursday morning he got up in morning and felt pain in left lower quadrant. Pain subside some early in day then pain worsened on Friday. He sent my chart message approaching weekend and he wanted antibiotic as he has hx of diverticultis. I sent in antibitoics but asked him to follow up today to check.   Pt reports he does report some residual pain now. When he sent my chart message had around 7/10. No fevers no chills or sweats.  Back in March 2024 he had.   IMPRESSION: Acute diverticulitis of the proximal sigmoid colon. No perforation or abscess.  This would be 3rd time of diverticultis overall per dicussion.   Pt has hx of multiple diverticuli in left colon. He reports will need to repeat study in the fall.   Review of Systems  Constitutional:  Negative for chills, fatigue and fever.  Respiratory:  Negative for cough, chest tightness, shortness of breath and wheezing.   Cardiovascular:  Negative for chest pain and palpitations.  Gastrointestinal:  Negative for abdominal distention, abdominal pain, anal bleeding, diarrhea, nausea and vomiting.       By exam no pain presently.  Genitourinary:  Negative for dysuria, flank pain and genital sores.  Musculoskeletal:  Negative for back pain.  Skin:  Negative for rash and wound.  Neurological:  Negative for dizziness, seizures, weakness and light-headedness.  Hematological:  Negative for adenopathy. Does not bruise/bleed easily.  Psychiatric/Behavioral:  Negative for behavioral problems and confusion.    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   Diverticulitis of colon with perforation 03/05/2022   Diverticulitis of intestine with perforation 03/05/2022   Dyspnea on exertion  01/14/2023   Elevated BP without diagnosis of hypertension 03/05/2022   Essential hypertension 01/14/2023   GERD (gastroesophageal reflux disease)    occ with spicy foods    Heavy alcohol consumption 03/05/2022   Internal prolapsed hemorrhoids 03/05/2022   Syncope and collapse 01/14/2023   Tobacco abuse 03/05/2022   Uncontrolled hypertension 03/05/2022     Social History   Socioeconomic History   Marital status: Married    Spouse name: Not on file   Number of children: Not on file   Years of education: Not on file   Highest education level: Not on file  Occupational History   Not on file  Tobacco Use   Smoking status: Every Day    Packs/day: 1    Types: Cigarettes   Smokeless tobacco: Never  Vaping Use   Vaping Use: Every day  Substance and Sexual Activity   Alcohol use: Yes    Comment: Drinks 5 out of 7 days.  Mostly beer.  Some liquor.   Drug use: Yes    Types: Marijuana    Comment: LAST SMOKED 08/11/20   Sexual activity: Yes  Other Topics Concern   Not on file  Social History Narrative   Are you right handed or left handed? Left handed    Are you currently employed ? no   What is your current occupation? na   Do you live at home alone?no   Who lives with you? Wife and pets   What type of home do  you live in: 1 story or 2 story? 1 story 4 or 5 steps        Social Determinants of Corporate investment banker Strain: Not on file  Food Insecurity: Not on file  Transportation Needs: Not on file  Physical Activity: Not on file  Stress: Not on file  Social Connections: Not on file  Intimate Partner Violence: Not on file    Past Surgical History:  Procedure Laterality Date   ANKLE FRACTURE SURGERY     plate    WISDOM TOOTH EXTRACTION      Family History  Adopted: Yes    No Known Allergies  Current Outpatient Medications on File Prior to Visit  Medication Sig Dispense Refill   ciprofloxacin (CIPRO) 500 MG tablet Take 1 tablet (500 mg total) by mouth 2  (two) times daily. 14 tablet 0   famotidine (PEPCID) 20 MG tablet TAKE 1 TABLET BY MOUTH TWICE A DAY (Patient taking differently: Take 20 mg by mouth as needed for heartburn or indigestion.) 60 tablet 0   HYDROcodone-acetaminophen (NORCO/VICODIN) 5-325 MG tablet Take 1 tablet by mouth every 6 (six) hours as needed for severe pain. 15 tablet 0   losartan (COZAAR) 100 MG tablet Take 1 tablet (100 mg total) by mouth daily. 90 tablet 3   metoprolol succinate (TOPROL XL) 50 MG 24 hr tablet Take 1 tablet (50 mg total) by mouth daily. 90 tablet 3   metroNIDAZOLE (FLAGYL) 500 MG tablet Take 1 tablet (500 mg total) by mouth 3 (three) times daily for 7 days. 21 tablet 0   ondansetron (ZOFRAN-ODT) 4 MG disintegrating tablet Take 1 tablet (4 mg total) by mouth every 8 (eight) hours as needed. 20 tablet 0   senna-docusate (SENOKOT-S) 8.6-50 MG tablet Take 1 tablet by mouth at bedtime as needed for mild constipation or moderate constipation. 20 tablet 0   No current facility-administered medications on file prior to visit.    BP (!) 145/80   Pulse 98   Resp 18   Ht 5\' 8"  (1.727 m)   Wt 162 lb 6.4 oz (73.7 kg)   SpO2 97%   BMI 24.69 kg/m           Objective:   Physical Exam  General- No acute distress. Pleasant patient. Neck- Full range of motion, no jvd Lungs- Clear, even and unlabored. Heart- regular rate and rhythm. Neurologic- CNII- XII grossly intact.  Abdomen- soft, NT, ND. + bowel sounds. Llq- non tender to palpation presently.  Back- no cva tenderness.        Assessment & Plan:   Patient Instructions  1. Left lower quadrant abdominal pain and Diverticular disease of left colon. Suspect diverticulitis and pain much improved/non on palpation presently.  Will get stat labs and review. Based on lab may extend course of antibiotic and may get ct abd/pelvis to assess/possibly confirm suspected dx  - CBC w/Diff - Comp Met (CMET)     3. Hypertension, unspecified type Bp better on  recheck but not ideal. Continue losartan and toprol. Check bp 3 times at home over next week. Send me my chart update in one week. Sooner if you seen readings above 150/90.  Follow up date to be determined after lab review.    Esperanza Richters, PA-C

## 2023-04-06 NOTE — Patient Instructions (Addendum)
1. Left lower quadrant abdominal pain and Diverticular disease of left colon. Suspect diverticulitis and pain much improved/non on palpation presently.  Will get stat labs and review. Based on lab may extend course of antibiotic and may get ct abd/pelvis to assess/possibly confirm suspected dx  - CBC w/Diff - Comp Met (CMET)     3. Hypertension, unspecified type Bp better on recheck but not ideal. Continue losartan and toprol. Check bp 3 times at home over next week. Send me my chart update in one week. Sooner if you seen readings above 150/90.  Follow up date to be determined after lab review.

## 2023-04-28 ENCOUNTER — Ambulatory Visit: Payer: Managed Care, Other (non HMO) | Admitting: Medical

## 2023-04-30 ENCOUNTER — Encounter: Payer: Self-pay | Admitting: Medical

## 2023-04-30 ENCOUNTER — Ambulatory Visit (INDEPENDENT_AMBULATORY_CARE_PROVIDER_SITE_OTHER): Payer: Managed Care, Other (non HMO) | Admitting: Medical

## 2023-04-30 VITALS — BP 130/80 | HR 111 | Temp 98.2°F | Resp 18 | Ht 68.0 in | Wt 162.6 lb

## 2023-04-30 DIAGNOSIS — I10 Essential (primary) hypertension: Secondary | ICD-10-CM

## 2023-04-30 DIAGNOSIS — G4452 New daily persistent headache (NDPH): Secondary | ICD-10-CM | POA: Diagnosis not present

## 2023-04-30 MED ORDER — HYDRALAZINE HCL 25 MG PO TABS: 25.0000 mg | ORAL_TABLET | Freq: Three times a day (TID) | ORAL | 0 refills | Status: DC

## 2023-04-30 NOTE — Progress Notes (Signed)
Subjective:    Patient ID: Christian Peterson, male    DOB: 03-04-1971, 52 y.o.   MRN: [id number]  HPI  Pt states his blood pressure has been increased.  He states his bp has been elevated in his opinion since med changes from cardilogist.  " Primary prevention stressed with the patient.  Importance of compliance with diet medication stressed and vocalized understanding. Essential hypertension: Blood pressure is elevated.  Heart rates are also elevated.  I discontinued amlodipine.  I doubled his Cozaar and added Toprol-XL 50 mg to his regimen.  He will keep a track of his pulse blood pressure and bring it to [place] back in a week.  Salt intake issues and diet emphasized.  Lifestyle modification urged. Syncope and collapse: He has had no issues since January.  He has been evaluated by primary care and neurology.  I told him about driving instructions.  He will be cleared by his primary care to drive.  In general I mentioned to him that a person with syncope needs clearance by primary care to try albuterol evaluation. Dyspnea on exertion: Aortic atherosclerosis: I discussed this with him at length and in view of risk factors we will do exercise stress Cardiolite. Cardiac murmur echocardiogram will be done to assess murmur heard on auscultation. Cigarette smoker: I spent 5 minutes with the patient discussing solely about smoking. Smoking cessation was counseled. I suggested to the patient also different medications and pharmacological interventions. Patient is keen to try stopping on its own at this time. He will get back to me if he needs any further assistance in this matter. Patient will be seen in follow-up appointment in 2 months or earlier if the patient has any concern"  Pt brings bp  reading over past 3 weeks. His bp varies.  Most recently bp in 140-150/90 range. But some readins as high as 160/100.  3 weeks ago some readings 197/128. No when he got these readings he did not have sweating  episode.   On review of his readings sometimes very good reading. But other times 30 point difference.   Pt has been having ha daily over past 2 months. No gross motor or sensory deficits.   He drinks 2 cups of coffee in the morning.     Review of Systems  Constitutional:  Negative for chills, fatigue and fever.  Respiratory:  Negative for cough, choking, shortness of breath and wheezing.   Cardiovascular:  Negative for chest pain and palpitations.  Genitourinary:  Negative for dysuria.  Musculoskeletal:  Negative for back pain.  Skin:  Negative for rash.  Neurological:  Positive for dizziness and headaches. Negative for syncope and weakness.       Headache every day. States notices headache daily by 10 am that last all day. He used ibuprofen. Ha level is 5. No light sensitvity but finds going in dark room. No sound sensitivity. New daily ha. Ct head recnelty was normal. At worst in pat rare transient self limited ha.  Hematological:  Negative for adenopathy. Does not bruise/bleed easily.  Psychiatric/Behavioral:  Negative for behavioral problems and confusion.    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   Diverticulitis of colon with perforation 03/05/2022   Diverticulitis of intestine with perforation 03/05/2022   Dyspnea on exertion 01/14/2023   Elevated BP without diagnosis of hypertension 03/05/2022  Essential hypertension 01/14/2023   GERD (gastroesophageal reflux disease)    occ with spicy foods    Heavy alcohol consumption 03/05/2022   Internal prolapsed hemorrhoids 03/05/2022   Syncope and collapse 01/14/2023   Tobacco abuse 03/05/2022   Uncontrolled hypertension 03/05/2022     Social History   Socioeconomic History   Marital status: Married    Spouse name: Not on file   Number of children: Not on file   Years of education: Not on file    Highest education level: Not on file  Occupational History   Not on file  Tobacco Use   Smoking status: Every Day    Packs/day: 1    Types: Cigarettes   Smokeless tobacco: Never  Vaping Use   Vaping Use: Every day  Substance and Sexual Activity   Alcohol use: Yes    Comment: Drinks 5 out of 7 days.  Mostly beer.  Some liquor.   Drug use: Yes    Types: Marijuana    Comment: LAST SMOKED 08/11/20   Sexual activity: Yes  Other Topics Concern   Not on file  Social History Narrative   Are you right handed or left handed? Left handed    Are you currently employed ? no   What is your current occupation? na   Do you live at home alone?no   Who lives with you? Wife and pets   What type of home do you live in: 1 story or 2 story? 1 story 4 or 5 steps        Social Determinants of Corporate investment banker Strain: Not on file  Food Insecurity: Not on file  Transportation Needs: Not on file  Physical Activity: Not on file  Stress: Not on file  Social Connections: Not on file  Intimate Partner Violence: Not on file    Past Surgical History:  Procedure Laterality Date   ANKLE FRACTURE SURGERY     plate    WISDOM TOOTH EXTRACTION      Family History  Adopted: Yes    No Known Allergies  Current Outpatient Medications on File Prior to Visit  Medication Sig Dispense Refill   famotidine (PEPCID) 20 MG tablet TAKE 1 TABLET BY MOUTH TWICE A DAY (Patient taking differently: Take 20 mg by mouth as needed for heartburn or indigestion.) 60 tablet 0   HYDROcodone-acetaminophen (NORCO/VICODIN) 5-325 MG tablet Take 1 tablet by mouth every 6 (six) hours as needed for severe pain. 15 tablet 0   losartan (COZAAR) 100 MG tablet Take 1 tablet (100 mg total) by mouth daily. 90 tablet 3   metoprolol succinate (TOPROL XL) 50 MG 24 hr tablet Take 1 tablet (50 mg total) by mouth daily. 90 tablet 3   ondansetron (ZOFRAN-ODT) 4 MG disintegrating tablet Take 1 tablet (4 mg total) by mouth every 8  (eight) hours as needed. 20 tablet 0   senna-docusate (SENOKOT-S) 8.6-50 MG tablet Take 1 tablet by mouth at bedtime as needed for mild constipation or moderate constipation. 20 tablet 0   No current facility-administered medications on file prior to visit.    BP 130/80 Comment: myself. twice. 2nd check his machine 135/90  Pulse (!) 111   Temp 98.2 F (36.8 C) (Oral)   Resp 18   Ht 5\' 8"  (1.727 m)   Wt 162 lb 9.6 oz (73.8 kg)   SpO2 95%   BMI 24.72 kg/m         Objective:   Physical Exam  General Mental Status- Alert. General Appearance- Not in acute distress.   Skin General: Color- Normal Color. Moisture- Normal Moisture.  Neck Carotid Arteries- Normal color. Moisture- Normal Moisture. No carotid bruits. No JVD.  Chest and Lung Exam Auscultation: Breath Sounds:-Normal.  Cardiovascular Auscultation:Rythm- Regular. Murmurs & Other Heart Sounds:Auscultation of the heart reveals- No Murmurs.  Abdomen Inspection:-Inspeection Normal. Palpation/Percussion:Note:No mass. Palpation and Percussion of the abdomen reveal- Non Tender, Non Distended + BS, no rebound or guarding.   Neurologic Cranial Nerve exam:- CN III-XII intact(No nystagmus), symmetric smile. Drift Test:- No drift. Finger to Nose:- Normal/Intact Strength:- 5/5 equal and symmetric strength both upper and lower extremities.       Assessment & Plan:   Patient Instructions  1. Hypertension, unspecified type Very high blood pressures with varying extemes some very high and some normal range. Some consecutive readings with 30 pt difference. My bp reading was good today 130/80 twice. I want you to gt new machine. Continue metoprolol and losartan. Get new machine. If you hav bp readings over 140/90 then add on hydralazine 25 mg tid. If bp less not to add one.  Update me on your bp readings by tuesday  2. New daily persistent headache  Your ha are moderate  with normal exam  today and ct head negative in  recent  past. Use tylenol presently for ha. If you have ha with motor or sensory deficits or with bp over 160/90 with new machine then advise ED evaluation.  Follow up date to be determined.  Esperanza Richters, PA-C

## 2023-04-30 NOTE — Patient Instructions (Addendum)
1. Hypertension, unspecified type Very high blood pressures with varying extemes some very high and some normal range. Some consecutive readings with 30 pt difference. My bp reading was good today 130/80 twice. I want you to gt new machine. Continue metoprolol and losartan. Get new machine. If you hav bp readings over 140/90 then add on hydralazine 25 mg tid. If bp less not to add one.  Update me on your bp readings by tuesday  2. New daily persistent headache  Your ha are moderate  with normal exam  today and ct head negative in recent  past. Use tylenol presently for ha. If you have ha with motor or sensory deficits or with bp over 160/90 with new machine then advise ED evaluation.  Follow up date to be determined.

## 2023-05-07 ENCOUNTER — Encounter: Payer: Self-pay | Admitting: Medical

## 2023-05-12 NOTE — Telephone Encounter (Signed)
N/V

## 2023-05-13 NOTE — Telephone Encounter (Signed)
NV scheduled 05/14/23. Please advise if anything else is needed

## 2023-05-14 ENCOUNTER — Ambulatory Visit: Payer: Managed Care, Other (non HMO) | Admitting: Internal Medicine

## 2023-05-14 ENCOUNTER — Other Ambulatory Visit: Payer: Self-pay | Admitting: *Deleted

## 2023-05-14 ENCOUNTER — Encounter: Payer: Self-pay | Admitting: *Deleted

## 2023-05-14 VITALS — BP 128/76 | HR 106

## 2023-05-14 DIAGNOSIS — I10 Essential (primary) hypertension: Secondary | ICD-10-CM | POA: Diagnosis not present

## 2023-05-14 MED ORDER — METOPROLOL SUCCINATE ER 50 MG PO TB24: 100.0000 mg | ORAL_TABLET | Freq: Every day | ORAL | 0 refills | Status: DC

## 2023-05-14 NOTE — Progress Notes (Unsigned)
Here for BP check. BPs are still slightly elevated at home. BP today is pretty good but his heart rate is 106. He also has mild headache, most days, typically in the afternoons, no associated nausea vomiting.  No worst headache of his life. Plan: Increase metoprolol to 100 mg daily, decrease to 75 mg daily if BP is too low or he feels weak. See PCP within the next 2 weeks for further evaluation of headache. Willow Ora, MD

## 2023-05-14 NOTE — Progress Notes (Unsigned)
Pt here for Blood pressure check per Ramon Dredge Saguier  Pt currently takes: Hydralazine 25 mg, Losartan 10 mg, and Metoprolol 50 mg  Pt has been checking at home and has been running high home.  140-150s and 90-107s.  He checks about 3 times a day.   Heart rate has been running like 106s.  No headaches or chest pain.   Pt reports compliance with medication  BP Readings from Last 3 Encounters:  04/30/23 130/80  04/06/23 (!) 145/80  02/05/23 (!) 147/104     BP today 128/76  HR 106   Pt advised per Dr. Drue Novel to increase the 2 tablets of the metoprolol daily and continue checking blood pressure.  If blood pressure drops or feel fatigue to cut metoprolol back to 1 and 1/2 tablet daily.  Patient then stated that he has been having headaches.  Dr. Drue Novel came in to speak to patient and advised above and to scheduled an appointment to discuss with pcp about headaches.  Appointment scheduled with Ramon Dredge to discuss headaches and to follow up blood pressure 06/03/23.

## 2023-05-16 ENCOUNTER — Encounter: Payer: Self-pay | Admitting: Internal Medicine

## 2023-05-27 ENCOUNTER — Encounter: Payer: Self-pay | Admitting: Gastroenterology

## 2023-05-30 ENCOUNTER — Other Ambulatory Visit: Payer: Self-pay | Admitting: Medical

## 2023-06-02 NOTE — Progress Notes (Deleted)
Pt here for Blood pressure check per Dr Drue Novel 05/14/23: Pt advised per Dr. Drue Novel to increase the Metoprolol 50 mg to 2 tablets daily of the metoprolol daily and continue checking blood pressure.  If blood pressure drops or feel fatigue to cut metoprolol back to 1 and 1/2 tablet daily.  Patient then stated that he has been having headaches.  Dr. Drue Novel came in to speak to patient and advised above and to scheduled an appointment to discuss with pcp about headaches.  Appointment scheduled with Ramon Dredge to discuss headaches and to follow up blood pressure 06/03/23    You are to take metoprolol 50mg  at 2 tablets daily and to check your blood pressures daily.    Pt currently takes:   Pt reports compliance with medication.  BP today @ = HR =  Pt advised per

## 2023-06-03 ENCOUNTER — Ambulatory Visit: Payer: Managed Care, Other (non HMO) | Admitting: Medical

## 2023-06-03 ENCOUNTER — Ambulatory Visit: Payer: Managed Care, Other (non HMO)

## 2023-06-03 ENCOUNTER — Encounter: Payer: Self-pay | Admitting: Medical

## 2023-06-03 VITALS — BP 148/80 | HR 90 | Resp 18 | Ht 68.0 in | Wt 161.0 lb

## 2023-06-03 DIAGNOSIS — F17200 Nicotine dependence, unspecified, uncomplicated: Secondary | ICD-10-CM | POA: Diagnosis not present

## 2023-06-03 DIAGNOSIS — I10 Essential (primary) hypertension: Secondary | ICD-10-CM | POA: Diagnosis not present

## 2023-06-03 DIAGNOSIS — M25561 Pain in right knee: Secondary | ICD-10-CM | POA: Diagnosis not present

## 2023-06-03 DIAGNOSIS — M25511 Pain in right shoulder: Secondary | ICD-10-CM

## 2023-06-03 DIAGNOSIS — G8929 Other chronic pain: Secondary | ICD-10-CM

## 2023-06-03 DIAGNOSIS — M25512 Pain in left shoulder: Secondary | ICD-10-CM

## 2023-06-03 MED ORDER — BUPROPION HCL ER (XL) 150 MG PO TB24: 150.0000 mg | ORAL_TABLET | Freq: Every day | ORAL | 1 refills | Status: DC

## 2023-06-03 NOTE — Patient Instructions (Addendum)
1. Essential hypertension -bp still high but better than before.  losartan 100 mg daily.  Increase Hydralazine to 50 mg tid and on metoprolol xl 100 mg daily.  2. Smoker -rx wellbutrin. Rx advisement  given.   3. Chronic pain of right knee - Ambulatory referral to Sports Medicine  4. Bilateral shoulder pain, unspecified chronicity - Ambulatory referral to Sports Medicine   Follow up date in office to be determined. Exact date to be determined based on your bp update in one week. Send me update on bp by my chart.

## 2023-06-03 NOTE — Progress Notes (Signed)
Subjective:    Patient ID: Christian Peterson, male    DOB: February 12, 1971, 52 y.o.   MRN: [id number]  HPI Pt had recent nurse bp check. His levels where high and pt saw Dr Drue Novel briefly.  Advise/change below.  05-14-2023 " BP Readings from Last 3 Encounters:  04/30/23 130/80  04/06/23 (!) 145/80  02/05/23 (!) 147/104      BP today 128/76  HR 106    Pt advised per Dr. Drue Novel to increase the 2 tablets of the metoprolol daily and continue checking blood pressure.  If blood pressure drops or feel fatigue to cut metoprolol back to 1 and 1/2 tablet daily.  Patient then stated that he has been having headaches.  Dr. Drue Novel came in to speak to patient and advised above and to scheduled an appointment to discuss with pcp about headaches.  Appointment scheduled with Ramon Dredge to discuss headaches and to follow up blood pressure 06/03/23."   Pt also on losartan 100 mg daily. Hydralazine 25 mg tid and on metoprolol xl 100 mg daily.  Since med chane bp 140-150/70-85 range. He states his prior ha have been less frequent and not as bad.  Pt notes decreased coffee from 3 cups a day down to 1 cup a day.  Pt does have new blood pressure machine.   Also he rt knee pain that he describes random pop for past 7-10 years. He states he has some mild-moderate intermittent pain that occurs with increased activity. He states in past he went to orthopedist who offered him injections more than 5 yrs ago.  But he does not want to do injection. He states no imaging of his knees done. He wants to referred to different specialist.  Review of Systems  Constitutional:  Negative for chills, fatigue and fever.  Respiratory:  Negative for choking, chest tightness, shortness of breath and wheezing.   Cardiovascular:  Negative for chest pain and palpitations.  Gastrointestinal:  Negative for abdominal pain.  Musculoskeletal:        Rt knee pain. See hpi.  Shoulder pain.  Hematological:  Negative for adenopathy. Does not  bruise/bleed easily.  Psychiatric/Behavioral:  Negative for behavioral problems and confusion.     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   Diverticulitis of colon with perforation 03/05/2022   Diverticulitis of intestine with perforation 03/05/2022   Dyspnea on exertion 01/14/2023   Elevated BP without diagnosis of hypertension 03/05/2022   Essential hypertension 01/14/2023   GERD (gastroesophageal reflux disease)    occ with spicy foods    Heavy alcohol consumption 03/05/2022   Internal prolapsed hemorrhoids 03/05/2022   Syncope and collapse 01/14/2023   Tobacco abuse 03/05/2022   Uncontrolled hypertension 03/05/2022     Social History   Socioeconomic History   Marital status: Married    Spouse name: Not on file   Number of children: Not on file   Years of education: Not on file   Highest education level: Not on file  Occupational History   Not on file  Tobacco Use   Smoking status: Every Day    Current packs/day: 1.00    Types: Cigarettes   Smokeless tobacco: Never  Vaping Use   Vaping status: Every Day  Substance and Sexual Activity   Alcohol use: Yes    Comment: Drinks 5 out of 7 days.  Mostly beer.  Some liquor.   Drug use: Yes    Types: Marijuana    Comment: LAST SMOKED 08/11/20   Sexual activity: Yes  Other Topics Concern   Not on file  Social History Narrative   Are you right handed or left handed? Left handed    Are you currently employed ? no   What is your current occupation? na   Do you live at home alone?no   Who lives with you? Wife and pets   What type of home do you live in: 1 story or 2 story? 1 story 4 or 5 steps        Social Determinants of Corporate investment banker Strain: Not on file  Food Insecurity: Not on file  Transportation Needs: Not on file  Physical Activity: Not on file  Stress: Not on file   Social Connections: Not on file  Intimate Partner Violence: Not on file    Past Surgical History:  Procedure Laterality Date   ANKLE FRACTURE SURGERY     plate    WISDOM TOOTH EXTRACTION      Family History  Adopted: Yes    No Known Allergies  Current Outpatient Medications on File Prior to Visit  Medication Sig Dispense Refill   famotidine (PEPCID) 20 MG tablet TAKE 1 TABLET BY MOUTH TWICE A DAY (Patient taking differently: Take 20 mg by mouth as needed for heartburn or indigestion.) 60 tablet 0   hydrALAZINE (APRESOLINE) 25 MG tablet TAKE 1 TABLET BY MOUTH THREE TIMES A DAY 90 tablet 0   HYDROcodone-acetaminophen (NORCO/VICODIN) 5-325 MG tablet Take 1 tablet by mouth every 6 (six) hours as needed for severe pain. 15 tablet 0   losartan (COZAAR) 100 MG tablet Take 1 tablet (100 mg total) by mouth daily. 90 tablet 3   metoprolol succinate (TOPROL XL) 50 MG 24 hr tablet Take 2 tablets (100 mg total) by mouth daily. 180 tablet 0   ondansetron (ZOFRAN-ODT) 4 MG disintegrating tablet Take 1 tablet (4 mg total) by mouth every 8 (eight) hours as needed. 20 tablet 0   senna-docusate (SENOKOT-S) 8.6-50 MG tablet Take 1 tablet by mouth at bedtime as needed for mild constipation or moderate constipation. 20 tablet 0   No current facility-administered medications on file prior to visit.    BP (!) 148/80 Comment: Espac 2nd check.  Pulse 90   Resp 18   Ht 5\' 8"  (1.727 m)   Wt 161 lb (73 kg)   SpO2 97%   BMI 24.48 kg/m        Objective:   Physical Exam  General Mental Status- Alert. General Appearance- Not in acute distress.   Skin General: Color- Normal Color. Moisture- Normal Moisture.  Neck Carotid Arteries- Normal color. Moisture- Normal Moisture. No carotid bruits. No JVD.  Chest and Lung Exam Auscultation: Breath Sounds:-Normal.  Cardiovascular Auscultation:Rythm- Regular. Murmurs & Other Heart Sounds:Auscultation of the heart reveals- No  Murmurs.   Neurologic Cranial Nerve exam:- CN III-XII intact(No nystagmus), symmetric smile. Strength:- 5/5 equal and symmetric strength both upper and lower extremities.       Assessment & Plan:   Patient Instructions  1. Essential hypertension -bp still high but better than before.  losartan 100 mg daily.  Increase Hydralazine to 50 mg tid and on metoprolol xl 100 mg daily.  2. Smoker -rx wellbutrin. Rx advisement  given.   3. Chronic pain of right knee - Ambulatory referral to Sports Medicine  4. Bilateral shoulder  pain, unspecified chronicity - Ambulatory referral to Sports Medicine   Follow up date in office to be determined. Exact date to be determined based on your bp update in one week. Send me update on bp by my chart.   Esperanza Richters, PA-C

## 2023-06-15 ENCOUNTER — Encounter: Payer: Self-pay | Admitting: Family Medicine

## 2023-06-15 ENCOUNTER — Ambulatory Visit (INDEPENDENT_AMBULATORY_CARE_PROVIDER_SITE_OTHER): Payer: Managed Care, Other (non HMO) | Admitting: Family Medicine

## 2023-06-15 ENCOUNTER — Other Ambulatory Visit: Payer: Self-pay

## 2023-06-15 ENCOUNTER — Ambulatory Visit (INDEPENDENT_AMBULATORY_CARE_PROVIDER_SITE_OTHER): Payer: Managed Care, Other (non HMO)

## 2023-06-15 VITALS — BP 152/98 | HR 95 | Ht 68.0 in | Wt 161.8 lb

## 2023-06-15 DIAGNOSIS — M25561 Pain in right knee: Secondary | ICD-10-CM

## 2023-06-15 DIAGNOSIS — M25512 Pain in left shoulder: Secondary | ICD-10-CM

## 2023-06-15 DIAGNOSIS — G8929 Other chronic pain: Secondary | ICD-10-CM

## 2023-06-15 DIAGNOSIS — M25511 Pain in right shoulder: Secondary | ICD-10-CM

## 2023-06-15 LAB — SEDIMENTATION RATE: Sed Rate: 23 mm/hr — ABNORMAL HIGH (ref 0–20)

## 2023-06-15 LAB — CK: Total CK: 63 U/L (ref 7–232)

## 2023-06-15 LAB — TSH: TSH: 0.56 u[IU]/mL (ref 0.35–5.50)

## 2023-06-15 NOTE — Progress Notes (Signed)
Labs look okay.  No evidence of polymyalgia rheumatica.  X-ray report is still pending.

## 2023-06-15 NOTE — Progress Notes (Signed)
I, Christian Peterson, CMA acting as a scribe for Christian Graham, MD.  Christian Peterson is a 52 y.o. male who presents to [facility] at [facility] today for right knee pain. Pt was seen by Christian Peterson on 06/03/23 and referred to Sports Medicine.   Today patient reports chronic right knee pain. Has done steroid injections in the past with short-term relief ortho). Pain deep to patella. Mechanical sx present with ambulation, painful popping. Denies visible swelling. Peripatellar pain. Denies past injury to the knee. Denies radiating pain into the legs.   Pt also c/o bilateral shoulder pain, difficulty with ADLs.  Pain is present with overhead motion especially prolonged for example brushing his hair.  This is a chronic ongoing issue.  He has had steroid injections in all 3 locations which helped temporarily.  However he is more interested in finding underlying issue and treating that more proactively than just proceeding with injections.  Diagnostic Imaging: no recent imaging of the knee.   Pertinent review of systems: No fevers or chills  Relevant historical information: Hypertension   Exam:  BP (!) 152/98   Pulse 95   Ht 5\' 8"  (1.727 m)   Wt 161 lb 12.8 oz (73.4 kg)   SpO2 96%   BMI 24.60 kg/m  General: Well Developed, well nourished, and in no acute distress.   MSK: Shoulders bilaterally normal-appearing normal motion.  Some pain with abduction. Intact strength. Mildly positive Hawkins and Neer's test.  Mildly positive empty can test. Pulses cap refill and sensation are intact distally.  Right knee decreased quadricep muscle bulk compared to left otherwise normal-appearing. Normal knee motion without crepitation. Tender palpation medial joint line minimally. Stable ligamentous exam. Intact strength.    Lab and Radiology Results  X-ray images bilateral shoulders and right knee obtained today personally and independently interpreted  Right shoulder: No  acute fracture.  Relatively normal-appearing shoulder.  Left shoulder: No acute fracture.  Mild AC and glenohumeral DJD.  Right knee: No fractures.  Minimal degenerative changes.  Await formal radiology review  Results for orders placed or performed in visit on 06/15/23 (from the past 72 hour(s))  TSH     Status: None   Collection Time: 06/15/23 11:00 AM  Result Value Ref Range   TSH 0.56 0.35 - 5.50 uIU/mL  CK (Creatine Kinase)     Status: None   Collection Time: 06/15/23 11:00 AM  Result Value Ref Range   Total CK 63 7 - 232 U/L  Sedimentation rate     Status: Abnormal   Collection Time: 06/15/23 11:00 AM  Result Value Ref Range   Sed Rate 23 (H) 0 - 20 mm/hr   No results found.     Assessment and Plan: 52 y.o. male with chronic right knee pain and chronic bilateral shoulder pain.  Knee pain thought to be patellofemoral pain syndrome or exacerbation of DJD or perhaps a degenerative meniscus tear.  This is a chronic ongoing issue treated multiple times in the past by other orthopedic practices with steroid injections that worked only temporarily.  He does have decreased quad bulk right compared to left.  He is a good candidate for trial of PT.  Plan refer to PT and recheck in a month.  Bilateral shoulder pain again thought to be more of a functional problem.  He does have a little bit of degenerative changes but nothing outstanding on x-ray.  He is a good candidate for PT.  His symptoms could  be perhaps related to polymyalgia rheumatica.  Plan for limited rheumatologic workup today.   PDMP not reviewed this encounter. Orders Placed This Encounter  Procedures   [place] LIMITED JOINT SPACE STRUCTURES LOW RIGHT(NO LINKED CHARGES)    Order Specific Question:   Reason for Exam (SYMPTOM  OR DIAGNOSIS REQUIRED)    Answer:   right knee pain    Order Specific Question:   Preferred imaging location?    Answer:   [facility] Knee AP/LAT W/[place] Right     Standing Status:   Future    Number of Occurrences:   1    Standing Expiration Date:   07/16/2023    Order Specific Question:   Reason for Exam (SYMPTOM  OR DIAGNOSIS REQUIRED)    Answer:   right knee pain    Order Specific Question:   Preferred imaging location?    Answer:   [facility]   DG Shoulder Right    Standing Status:   Future    Number of Occurrences:   1    Standing Expiration Date:   06/14/2024    Order Specific Question:   Reason for Exam (SYMPTOM  OR DIAGNOSIS REQUIRED)    Answer:   eval shoudler pain r    Order Specific Question:   Preferred imaging location?    Answer:   [facility]   DG Shoulder Left    Standing Status:   Future    Number of Occurrences:   1    Standing Expiration Date:   06/14/2024    Order Specific Question:   Reason for Exam (SYMPTOM  OR DIAGNOSIS REQUIRED)    Answer:   eval shoudler pain    Order Specific Question:   Preferred imaging location?    Answer:   [facility]   Sedimentation rate    Standing Status:   Future    Number of Occurrences:   1    Standing Expiration Date:   06/14/2024   TSH   CK (Creatine Kinase)   Ambulatory referral to Physical Therapy    Referral Priority:   Routine    Referral Type:   Physical Medicine    Referral Reason:   Specialty Services Required    Requested Specialty:   Physical Therapy    Number of Visits Requested:   1   No orders of the defined types were placed in this encounter.    Discussed warning signs or symptoms. Please see discharge instructions. Patient expresses understanding.   The above documentation has been reviewed and is accurate and complete Christian Peterson, M.D.

## 2023-06-15 NOTE — Patient Instructions (Addendum)
Thank you for coming in today.   Please get an Xray today before you leave   Please get labs today before you leave   I've referred you to Physical Therapy.  Let us know if you don't hear from them in one week.   Recheck in 1 month.   Let me know sooner if this is not working.

## 2023-06-21 NOTE — Progress Notes (Signed)
Right shoulder x-ray shows a little bit of arthritis or spurring at the top of the shoulder.

## 2023-06-21 NOTE — Progress Notes (Signed)
Left shoulder x-ray shows little bit of spurring at the small joint the top of the shoulder.

## 2023-06-21 NOTE — Progress Notes (Signed)
Right knee x-ray looks normal to radiology

## 2023-06-25 ENCOUNTER — Other Ambulatory Visit: Payer: Self-pay | Admitting: Medical

## 2023-07-15 ENCOUNTER — Ambulatory Visit: Payer: Managed Care, Other (non HMO) | Admitting: Family Medicine

## 2023-07-21 ENCOUNTER — Other Ambulatory Visit: Payer: Self-pay | Admitting: Medical

## 2023-08-09 ENCOUNTER — Other Ambulatory Visit: Payer: Self-pay

## 2023-08-09 ENCOUNTER — Emergency Department (HOSPITAL_BASED_OUTPATIENT_CLINIC_OR_DEPARTMENT_OTHER)
Admission: EM | Admit: 2023-08-09 | Discharge: 2023-08-09 | Disposition: A | Discharge status: Home / Self Care | Payer: Managed Care, Other (non HMO) | Attending: Emergency Medicine | Admitting: Emergency Medicine

## 2023-08-09 ENCOUNTER — Encounter (HOSPITAL_BASED_OUTPATIENT_CLINIC_OR_DEPARTMENT_OTHER): Payer: Self-pay | Admitting: Emergency Medicine

## 2023-08-09 ENCOUNTER — Emergency Department (HOSPITAL_BASED_OUTPATIENT_CLINIC_OR_DEPARTMENT_OTHER): Payer: Managed Care, Other (non HMO)

## 2023-08-09 DIAGNOSIS — R1084 Generalized abdominal pain: Secondary | ICD-10-CM | POA: Diagnosis present

## 2023-08-09 LAB — COMPREHENSIVE METABOLIC PANEL
ALT: 53 U/L — ABNORMAL HIGH (ref 0–44)
AST: 35 U/L (ref 15–41)
Albumin: 3.9 g/dL (ref 3.5–5.0)
Alkaline Phosphatase: 60 U/L (ref 38–126)
Anion gap: 11 (ref 5–15)
BUN: 13 mg/dL (ref 6–20)
CO2: 25 mmol/L (ref 22–32)
Calcium: 9.2 mg/dL (ref 8.9–10.3)
Chloride: 99 mmol/L (ref 98–111)
Creatinine, Ser: 0.69 mg/dL (ref 0.61–1.24)
GFR, Estimated: >60 mL/min (ref >60)
Glucose, Bld: 111 mg/dL — ABNORMAL HIGH (ref 70–99)
Potassium: 4.3 mmol/L (ref 3.5–5.1)
Sodium: 135 mmol/L (ref 135–145)
Total Bilirubin: 0.7 mg/dL (ref 0.3–1.2)
Total Protein: 7.4 g/dL (ref 6.5–8.1)

## 2023-08-09 LAB — CBC WITH DIFFERENTIAL/PLATELET
Abs Immature Granulocytes: 0.06 10*3/uL (ref 0.00–0.07)
Basophils Absolute: 0.1 10*3/uL (ref 0.0–0.1)
Basophils Relative: 1 %
Eosinophils Absolute: 0.2 10*3/uL (ref 0.0–0.5)
Eosinophils Relative: 2 %
HCT: 45.7 % (ref 39.0–52.0)
Hemoglobin: 15.8 g/dL (ref 13.0–17.0)
Immature Granulocytes: 1 %
Lymphocytes Relative: 13 %
Lymphs Abs: 1.1 10*3/uL (ref 0.7–4.0)
MCH: 32.8 pg (ref 26.0–34.0)
MCHC: 34.6 g/dL (ref 30.0–36.0)
MCV: 94.8 fL (ref 80.0–100.0)
Monocytes Absolute: 0.9 10*3/uL (ref 0.1–1.0)
Monocytes Relative: 11 %
Neutro Abs: 5.7 10*3/uL (ref 1.7–7.7)
Neutrophils Relative %: 72 %
Platelets: 228 10*3/uL (ref 150–400)
RBC: 4.82 MIL/uL (ref 4.22–5.81)
RDW: 11.6 % (ref 11.5–15.5)
WBC: 8 10*3/uL (ref 4.0–10.5)
nRBC: 0 % (ref 0.0–0.2)

## 2023-08-09 LAB — LIPASE, BLOOD: Lipase: 47 U/L (ref 11–51)

## 2023-08-09 MED ORDER — ALUM & MAG HYDROXIDE-SIMETH 200-200-20 MG/5ML PO SUSP
30.0000 mL | Freq: Once | ORAL | Status: AC
  Administered 2023-08-09: 30 mL via ORAL
  Filled 2023-08-09: qty 30

## 2023-08-09 MED ORDER — ONDANSETRON 4 MG PO TBDP: ORAL_TABLET | ORAL | 0 refills | Status: DC

## 2023-08-09 MED ORDER — SODIUM CHLORIDE 0.9 % IV BOLUS: 1000.0000 mL | Freq: Once | INTRAVENOUS | Status: DC

## 2023-08-09 MED ORDER — MORPHINE SULFATE 15 MG PO TABS: 7.5000 mg | ORAL_TABLET | ORAL | 0 refills | Status: DC | PRN

## 2023-08-09 MED ORDER — ONDANSETRON HCL 4 MG/2ML IJ SOLN
4.0000 mg | Freq: Once | INTRAMUSCULAR | Status: AC
  Administered 2023-08-09: 4 mg via INTRAVENOUS
  Filled 2023-08-09: qty 2

## 2023-08-09 MED ORDER — SODIUM CHLORIDE 0.9 % IV BOLUS
1000.0000 mL | Freq: Once | INTRAVENOUS | Status: AC
  Administered 2023-08-09: 1000 mL via INTRAVENOUS

## 2023-08-09 MED ORDER — MORPHINE SULFATE (PF) 4 MG/ML IV SOLN
4.0000 mg | Freq: Once | INTRAVENOUS | Status: AC
  Administered 2023-08-09: 4 mg via INTRAVENOUS
  Filled 2023-08-09: qty 1

## 2023-08-09 MED ORDER — IOHEXOL 300 MG/ML  SOLN
100.0000 mL | Freq: Once | INTRAMUSCULAR | Status: AC | PRN
  Administered 2023-08-09: 100 mL via INTRAVENOUS

## 2023-08-09 NOTE — ED Triage Notes (Signed)
Pt to ER with c/o LLQ abdominal pain. States hx of diverticulitis and this feels similar.  States pain started on Saturday with mild nausea. Denies vomiting or diarrhea.

## 2023-08-09 NOTE — ED Provider Notes (Signed)
[facility] Provider Note   CSN: [id number] Arrival date & time: 08/09/23  4098     History  Chief Complaint  Patient presents with   Abdominal Pain    Christian Peterson is a 52 y.o. male.  52 yo M with a chief complaints of abdominal pain.  This feels like when he had diverticulitis in the past.  He is actually had it 3 times this year.  The first time he was admitted to the hospital for microperforation.  This has been going on for about 72 hours.  Seems to come and go in severity.  Similar in feeling to his prior events.  Has had nausea but denies vomiting.  Denies urinary symptoms.  Denies trauma.   Abdominal Pain      Home Medications Prior to Admission medications   Medication Sig Start Date End Date Taking? Authorizing Provider  morphine (MSIR) 15 MG tablet Take 0.5 tablets (7.5 mg total) by mouth every 4 (four) hours as needed for severe pain. 08/09/23  Yes Melene Plan, DO  ondansetron (ZOFRAN-ODT) 4 MG disintegrating tablet 4mg  ODT q4 hours prn nausea/vomit 08/09/23  Yes Melene Plan, DO  buPROPion (WELLBUTRIN XL) 150 MG 24 hr tablet TAKE 1 TABLET BY MOUTH EVERY DAY 07/21/23   Saguier, Ramon Dredge, PA-C  famotidine (PEPCID) 20 MG tablet TAKE 1 TABLET BY MOUTH TWICE A DAY Patient taking differently: Take 20 mg by mouth as needed for heartburn or indigestion. 04/06/22   Saguier, Ramon Dredge, PA-C  hydrALAZINE (APRESOLINE) 25 MG tablet TAKE 1 TABLET BY MOUTH THREE TIMES A DAY 06/25/23   Saguier, Ramon Dredge, PA-C  HYDROcodone-acetaminophen (NORCO/VICODIN) 5-325 MG tablet Take 1 tablet by mouth every 6 (six) hours as needed for severe pain. 02/05/23   Long, Arlyss Repress, MD  losartan (COZAAR) 100 MG tablet Take 1 tablet (100 mg total) by mouth daily. 01/14/23   Revankar, Aundra Dubin, MD  metoprolol succinate (TOPROL XL) 50 MG 24 hr tablet Take 2 tablets (100 mg total) by mouth daily. 05/14/23   Wanda Plump, MD  senna-docusate (SENOKOT-S) 8.6-50 MG tablet Take 1  tablet by mouth at bedtime as needed for mild constipation or moderate constipation. 02/05/23   Long, Arlyss Repress, MD      Allergies    Patient has no known allergies.    Review of Systems   Review of Systems  Gastrointestinal:  Positive for abdominal pain.    Physical Exam Updated Vital Signs BP (!) 160/99   Pulse 67   Temp 97.9 F (36.6 C)   Resp 18   Ht 5\' 8"  (1.727 m)   Wt 70.3 kg   SpO2 96%   BMI 23.57 kg/m  Physical Exam Vitals and nursing note reviewed.  Constitutional:      Appearance: He is well-developed.  HENT:     Head: Normocephalic and atraumatic.  Eyes:     Pupils: Pupils are equal, round, and reactive to light.  Neck:     Vascular: No JVD.  Cardiovascular:     Rate and Rhythm: Normal rate and regular rhythm.     Heart sounds: No murmur heard.    No friction rub. No gallop.  Pulmonary:     Effort: No respiratory distress.     Breath sounds: No wheezing.  Abdominal:     General: There is no distension.     Tenderness: There is no abdominal tenderness. There is no guarding or rebound.  Musculoskeletal:  General: Normal range of motion.     Cervical back: Normal range of motion and neck supple.  Skin:    Coloration: Skin is not pale.     Findings: No rash.  Neurological:     Mental Status: He is alert and oriented to person, place, and time.  Psychiatric:        Behavior: Behavior normal.     ED Results / Procedures / Treatments   Labs (all labs ordered are listed, but only abnormal results are displayed) Labs Reviewed  COMPREHENSIVE METABOLIC PANEL - Abnormal; Notable for the following components:      Result Value   Glucose, Bld 111 (*)    ALT 53 (*)    All other components within normal limits  CBC WITH DIFFERENTIAL/PLATELET  LIPASE, BLOOD    EKG None  Radiology CT ABDOMEN PELVIS W CONTRAST  Result Date: 08/09/2023 CLINICAL DATA:  Acute left lower quadrant abdominal pain. EXAM: CT ABDOMEN AND PELVIS WITH CONTRAST TECHNIQUE:  Multidetector CT imaging of the abdomen and pelvis was performed using the standard protocol following bolus administration of intravenous contrast. RADIATION DOSE REDUCTION: This exam was performed according to the departmental dose-optimization program which includes automated exposure control, adjustment of the mA and/or kV according to patient size and/or use of iterative reconstruction technique. CONTRAST:  OMNIPAQUE IOHEXOL 300 MG/ML  SOLN COMPARISON:  February 05, 2023. FINDINGS: Lower chest: No acute abnormality. Hepatobiliary: No focal liver abnormality is seen. No gallstones, gallbladder wall thickening, or biliary dilatation. Pancreas: Unremarkable. No pancreatic ductal dilatation or surrounding inflammatory changes. Spleen: Normal in size without focal abnormality. Adrenals/Urinary Tract: Adrenal glands appear normal. Small nonobstructive left renal calculus. No hydronephrosis or renal obstruction is noted. Urinary bladder is unremarkable. Stomach/Bowel: Stomach is within normal limits. Appendix appears normal. No evidence of bowel obstruction. Wall thickening of proximal duodenum is noted suggesting possible peptic ulcer disease. Sigmoid diverticulosis is noted without inflammation. Vascular/Lymphatic: Aortic atherosclerosis. No enlarged abdominal or pelvic lymph nodes. Reproductive: Prostate is unremarkable. Other: Small fat containing right inguinal hernia.  No ascites. Musculoskeletal: No acute or significant osseous findings. IMPRESSION: Moderate wall thickening of proximal duodenum suggesting peptic ulcer disease. Small nonobstructive left renal calculus. Sigmoid diverticulosis without inflammation. Small fat containing right inguinal hernia. Aortic Atherosclerosis (ICD10-I70.0). Electronically Signed   By: Lupita Raider M.D.   On: 08/09/2023 12:39    Procedures Procedures    Medications Ordered in ED Medications  sodium chloride 0.9 % bolus 1,000 mL (0 mLs Intravenous Stopped 08/09/23  1213)  morphine (PF) 4 MG/ML injection 4 mg (4 mg Intravenous Given 08/09/23 1001)  ondansetron (ZOFRAN) injection 4 mg (4 mg Intravenous Given 08/09/23 1000)  iohexol (OMNIPAQUE) 300 MG/ML solution 100 mL (100 mLs Intravenous Contrast Given 08/09/23 1059)  morphine (PF) 4 MG/ML injection 4 mg (4 mg Intravenous Given 08/09/23 1154)  ondansetron (ZOFRAN) injection 4 mg (4 mg Intravenous Given 08/09/23 1154)  alum & mag hydroxide-simeth (MAALOX/MYLANTA) 200-200-20 MG/5ML suspension 30 mL (30 mLs Oral Given 08/09/23 1318)    ED Course/ Medical Decision Making/ A&P                                 Medical Decision Making Amount and/or Complexity of Data Reviewed Labs: ordered. Radiology: ordered.  Risk OTC drugs. Prescription drug management.   52 yo M with a chief complaints of abdominal pain.  Feels like when he had diverticulitis in the  past.  Unfortunately the patient has had a history of complicated diverticulitis.  Will obtain CT imaging.  Treat pain and nausea.  Blood work reassess.  Lab work without significant leukocytosis, no acute anemia, LFTs and lipase are unremarkable.  CT imaging without diverticulitis.  Radiology read with some concern for peptic ulcer disease.  I discussed this with the patient.  Will have him follow-up with his gastroenterologist in the office.  1:21 PM:  I have discussed the diagnosis/risks/treatment options with the patient.  Evaluation and diagnostic testing in the emergency department does not suggest an emergent condition requiring admission or immediate intervention beyond what has been performed at this time.  They will follow up with GI. We also discussed returning to the ED immediately if new or worsening sx occur. We discussed the sx which are most concerning (e.g., sudden worsening pain, fever, inability to tolerate by mouth) that necessitate immediate return. Medications administered to the patient during their visit and any new prescriptions provided to  the patient are listed below.  Medications given during this visit Medications  sodium chloride 0.9 % bolus 1,000 mL (0 mLs Intravenous Stopped 08/09/23 1213)  morphine (PF) 4 MG/ML injection 4 mg (4 mg Intravenous Given 08/09/23 1001)  ondansetron (ZOFRAN) injection 4 mg (4 mg Intravenous Given 08/09/23 1000)  iohexol (OMNIPAQUE) 300 MG/ML solution 100 mL (100 mLs Intravenous Contrast Given 08/09/23 1059)  morphine (PF) 4 MG/ML injection 4 mg (4 mg Intravenous Given 08/09/23 1154)  ondansetron (ZOFRAN) injection 4 mg (4 mg Intravenous Given 08/09/23 1154)  alum & mag hydroxide-simeth (MAALOX/MYLANTA) 200-200-20 MG/5ML suspension 30 mL (30 mLs Oral Given 08/09/23 1318)     The patient appears reasonably screen and/or stabilized for discharge and I doubt any other medical condition or other [facility] requiring further screening, evaluation, or treatment in the ED at this time prior to discharge.          Final Clinical Impression(s) / ED Diagnoses Final diagnoses:  Generalized abdominal pain    Rx / DC Orders ED Discharge Orders          Ordered    morphine (MSIR) 15 MG tablet  Every 4 hours PRN        08/09/23 1314    ondansetron (ZOFRAN-ODT) 4 MG disintegrating tablet        08/09/23 1314              [place], DO 08/09/23 1321

## 2023-08-09 NOTE — Discharge Instructions (Addendum)
Try pepcid or tagamet up to twice a day.  Try to avoid things that may make this worse, most commonly these are spicy foods tomato based products fatty foods chocolate and peppermint.  Alcohol and tobacco can also make this worse.  Return to the emergency department for sudden worsening pain fever or inability to eat or drink.

## 2023-08-10 ENCOUNTER — Telehealth: Payer: Self-pay | Admitting: Gastroenterology

## 2023-08-10 ENCOUNTER — Other Ambulatory Visit: Payer: Self-pay | Admitting: Internal Medicine

## 2023-08-10 NOTE — Telephone Encounter (Signed)
Left patient a detailed vm with information below. I asked that patient give me a call back to confirm the appt for tomorrow with Dr. Myrtie Neither.

## 2023-08-10 NOTE — Telephone Encounter (Signed)
Pt returned call. Confirmed appt for tomorrow with Dr. Myrtie Neither. January appt has been cancelled.

## 2023-08-10 NOTE — Telephone Encounter (Signed)
This patient of mine was seen in the ED yesterday for recurrent left lower quadrant pain.  He had diverticulitis earlier this year.  CT scan abdomen in ED this time did not show diverticulitis, but there were some changes suggestive of possible peptic ulcer.  Clinic visits are limited as you know.  As it happens, I could see him tomorrow, 08/11/23 at 1:20 PM (1 PM arrival). The patient who is currently in that 120 slot can be canceled (appointment not needed after discussion with his daughter after his procedure yesterday).  H Danis

## 2023-08-11 ENCOUNTER — Ambulatory Visit (INDEPENDENT_AMBULATORY_CARE_PROVIDER_SITE_OTHER): Payer: Managed Care, Other (non HMO) | Admitting: Gastroenterology

## 2023-08-11 ENCOUNTER — Encounter: Payer: Self-pay | Admitting: Gastroenterology

## 2023-08-11 VITALS — BP 120/80 | HR 79 | Ht 68.0 in | Wt 159.2 lb

## 2023-08-11 DIAGNOSIS — K219 Gastro-esophageal reflux disease without esophagitis: Secondary | ICD-10-CM

## 2023-08-11 DIAGNOSIS — R1032 Left lower quadrant pain: Secondary | ICD-10-CM | POA: Diagnosis not present

## 2023-08-11 DIAGNOSIS — R933 Abnormal findings on diagnostic imaging of other parts of digestive tract: Secondary | ICD-10-CM | POA: Diagnosis not present

## 2023-08-11 DIAGNOSIS — K5792 Diverticulitis of intestine, part unspecified, without perforation or abscess without bleeding: Secondary | ICD-10-CM | POA: Diagnosis not present

## 2023-08-11 MED ORDER — NA SULFATE-K SULFATE-MG SULF 17.5-3.13-1.6 GM/177ML PO SOLN: 1.0000 | Freq: Once | ORAL | 0 refills | Status: AC

## 2023-08-11 MED ORDER — CIPROFLOXACIN HCL 500 MG PO TABS: 500.0000 mg | ORAL_TABLET | Freq: Two times a day (BID) | ORAL | 0 refills | Status: DC

## 2023-08-11 MED ORDER — METRONIDAZOLE 500 MG PO TABS: 500.0000 mg | ORAL_TABLET | Freq: Two times a day (BID) | ORAL | 0 refills | Status: DC

## 2023-08-11 NOTE — Progress Notes (Deleted)
[facility] Consult Note:  History: Christian Peterson 08/11/2023  Referring provider: Esperanza Richters, PA-C  Reason for consult/chief complaint: No chief complaint on file.   Subjective  HPI: Summary of GI issues: Screening colonoscopy with Dr. Myrtie Neither September 20 21-2 subcentimeter and one 10 mm tubular adenoma and sigmoid hyperplastic polyp __________  On this occasion, Christian Peterson was referred to [place] after a recent ED visit for abdominal pain.  He went to the ED the day before yesterday for left lower quadrant pain that felt reminiscent of previous episodes of diverticulitis confirmed on CT scans March 2024 and April 2023.  (See CT scan reports below).  The 2023 episode had a microperforation causing some inflammatory changes and possibly regional ileus of adjacent small bowel loops.  Has required hospitalization for IV antibiotics but no surgery.  The episode in March of this year was treated with outpatient antibiotics.   Chart review indicates that there was an episode of presumptive diverticulitis based on symptoms in May of this year that primary care treated with antibiotics. Lab and imaging findings from this week's ED visit noted below, no diverticulitis was seen.  There is a question of some proximal duodenal wall thickening.   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   Diverticulitis of colon with perforation 03/05/2022   Diverticulitis of intestine with perforation 03/05/2022   Dyspnea on exertion 01/14/2023   Elevated BP without diagnosis of hypertension 03/05/2022   Essential hypertension 01/14/2023   GERD (gastroesophageal reflux disease)    occ with spicy foods    Heavy alcohol consumption 03/05/2022   Internal prolapsed hemorrhoids 03/05/2022   Syncope and collapse 01/14/2023    Tobacco abuse 03/05/2022   Uncontrolled hypertension 03/05/2022     Past Surgical History: Past Surgical History:  Procedure Laterality Date   ANKLE FRACTURE SURGERY     plate    WISDOM TOOTH EXTRACTION       Family History: Family History  Adopted: Yes    Social History: Social History   Socioeconomic History   Marital status: Married    Spouse name: Not on file   Number of children: Not on file   Years of education: Not on file   Highest education level: Not on file  Occupational History   Not on file  Tobacco Use   Smoking status: Every Day    Current packs/day: 1.00    Types: Cigarettes   Smokeless tobacco: Never  Vaping Use   Vaping status: Every Day  Substance and Sexual Activity   Alcohol use: Yes    Comment: Drinks 5 out of 7 days.  Mostly beer.  Some liquor.   Drug use: Yes    Types: Marijuana    Comment: LAST SMOKED 08/11/20   Sexual activity: Yes  Other Topics Concern   Not on file  Social History Narrative   Are you right handed or left handed? Left handed    Are you currently employed ? no   What is your current occupation? na   Do you live at home alone?no   Who lives with you? Wife and pets   What type of home do you live in: 1 story or 2 story? 1 story 4 or 5 steps        Social Determinants  of Health   Financial Resource Strain: Not on file  Food Insecurity: Not on file  Transportation Needs: Not on file  Physical Activity: Not on file  Stress: Not on file  Social Connections: Not on file    Allergies: No Known Allergies  Outpatient Meds: Current Outpatient Medications  Medication Sig Dispense Refill   buPROPion (WELLBUTRIN XL) 150 MG 24 hr tablet TAKE 1 TABLET BY MOUTH EVERY DAY 30 tablet 1   famotidine (PEPCID) 20 MG tablet TAKE 1 TABLET BY MOUTH TWICE A DAY (Patient taking differently: Take 20 mg by mouth as needed for heartburn or indigestion.) 60 tablet 0   hydrALAZINE (APRESOLINE) 25 MG tablet TAKE 1 TABLET BY MOUTH  THREE TIMES A DAY 270 tablet 1   HYDROcodone-acetaminophen (NORCO/VICODIN) 5-325 MG tablet Take 1 tablet by mouth every 6 (six) hours as needed for severe pain. 15 tablet 0   losartan (COZAAR) 100 MG tablet Take 1 tablet (100 mg total) by mouth daily. 90 tablet 3   metoprolol succinate (TOPROL-XL) 50 MG 24 hr tablet Take 2 tablets (100 mg total) by mouth daily. 60 tablet 0   morphine (MSIR) 15 MG tablet Take 0.5 tablets (7.5 mg total) by mouth every 4 (four) hours as needed for severe pain. 2 tablet 0   ondansetron (ZOFRAN-ODT) 4 MG disintegrating tablet 4mg  ODT q4 hours prn nausea/vomit 20 tablet 0   senna-docusate (SENOKOT-S) 8.6-50 MG tablet Take 1 tablet by mouth at bedtime as needed for mild constipation or moderate constipation. 20 tablet 0   No current facility-administered medications for this visit.      ___________________________________________________________________ Objective   Exam:  There were no vitals taken for this visit. Wt Readings from Last 3 Encounters:  08/09/23 155 lb (70.3 kg)  06/15/23 161 lb 12.8 oz (73.4 kg)  06/03/23 161 lb (73 kg)    General: ***  Eyes: sclera anicteric, no redness ENT: oral mucosa moist without lesions, no cervical or supraclavicular lymphadenopathy CV: ***, no JVD, no peripheral edema Resp: clear to auscultation bilaterally, normal RR and effort noted GI: soft, *** tenderness, with active bowel sounds. No guarding or palpable organomegaly noted. Skin; warm and dry, no rash or jaundice noted Neuro: awake, alert and oriented x 3. Normal gross motor function and fluent speech  Labs:     Latest Ref Rng & Units 08/09/2023    9:55 AM 04/06/2023   11:35 AM 02/05/2023    1:23 PM  CBC  WBC 4.0 - 10.5 K/uL 8.0  8.3  11.7   Hemoglobin 13.0 - 17.0 g/dL 29.5  28.4  13.2   Hematocrit 39.0 - 52.0 % 45.7  50.5  50.9   Platelets 150 - 400 K/uL 228  204.0  206       Latest Ref Rng & Units 08/09/2023    9:55 AM 04/06/2023   11:35 AM  02/05/2023    1:23 PM  CMP  Glucose 70 - 99 mg/dL 440  84  102   BUN 6 - 20 mg/dL 13  13  11    Creatinine 0.61 - 1.24 mg/dL 7.25  3.66  4.40   Sodium 135 - 145 mmol/L 135  139  131   Potassium 3.5 - 5.1 mmol/L 4.3  4.3  3.7   Chloride 98 - 111 mmol/L 99  98  96   CO2 22 - 32 mmol/L 25  28  24    Calcium 8.9 - 10.3 mg/dL 9.2  9.5  9.3   Total Protein  6.5 - 8.1 g/dL 7.4  7.4  8.0   Total Bilirubin 0.3 - 1.2 mg/dL 0.7  0.5  0.9   Alkaline Phos 38 - 126 U/L 60  72  90   AST 15 - 41 U/L 35  92  52   ALT 0 - 44 U/L 53  76  52      Radiologic Studies:   CLINICAL DATA:  Acute left lower quadrant abdominal pain.   EXAM: CT ABDOMEN AND PELVIS WITH CONTRAST   TECHNIQUE: Multidetector CT imaging of the abdomen and pelvis was performed using the standard protocol following bolus administration of intravenous contrast.   RADIATION DOSE REDUCTION: This exam was performed according to the departmental dose-optimization program which includes automated exposure control, adjustment of the mA and/or kV according to patient size and/or use of iterative reconstruction technique.   CONTRAST:  OMNIPAQUE IOHEXOL 300 MG/ML  SOLN   COMPARISON:  February 05, 2023.   FINDINGS: Lower chest: No acute abnormality.   Hepatobiliary: No focal liver abnormality is seen. No gallstones, gallbladder wall thickening, or biliary dilatation.   Pancreas: Unremarkable. No pancreatic ductal dilatation or surrounding inflammatory changes.   Spleen: Normal in size without focal abnormality.   Adrenals/Urinary Tract: Adrenal glands appear normal. Small nonobstructive left renal calculus. No hydronephrosis or renal obstruction is noted. Urinary bladder is unremarkable.   Stomach/Bowel: Stomach is within normal limits. Appendix appears normal. No evidence of bowel obstruction. Wall thickening of proximal duodenum is noted suggesting possible peptic ulcer disease. Sigmoid diverticulosis is noted without  inflammation.   Vascular/Lymphatic: Aortic atherosclerosis. No enlarged abdominal or pelvic lymph nodes.   Reproductive: Prostate is unremarkable.   Other: Small fat containing right inguinal hernia.  No ascites.   Musculoskeletal: No acute or significant osseous findings.   IMPRESSION: Moderate wall thickening of proximal duodenum suggesting peptic ulcer disease.   Small nonobstructive left renal calculus.   Sigmoid diverticulosis without inflammation.   Small fat containing right inguinal hernia.   Aortic Atherosclerosis (ICD10-I70.0).     Electronically Signed   By: Lupita Raider M.D.   On: 08/09/2023 12:39   (No oral contrast.  Images personally reviewed-H Danis) ______________________________________  CLINICAL DATA:  Abdominal pain, history of diverticulitis   EXAM: CT ABDOMEN AND PELVIS WITH CONTRAST   TECHNIQUE: Multidetector CT imaging of the abdomen and pelvis was performed using the standard protocol following bolus administration of intravenous contrast.   RADIATION DOSE REDUCTION: This exam was performed according to the departmental dose-optimization program which includes automated exposure control, adjustment of the mA and/or kV according to patient size and/or use of iterative reconstruction technique.   CONTRAST:  OMNIPAQUE IOHEXOL 300 MG/ML  SOLN   COMPARISON:  None Available.   FINDINGS: Lower chest: Lung bases are clear.   Hepatobiliary: No focal hepatic lesion. Normal gallbladder. No biliary duct dilatation. Common bile duct is normal.   Pancreas: Pancreas is normal. No ductal dilatation. No pancreatic inflammation.   Spleen: Normal spleen   Adrenals/urinary tract: Adrenal glands and kidneys are normal. The ureters and bladder normal.   Stomach/Bowel: Stomach, small bowel, appendix, and cecum are normal. Ascending and transverse colon normal. Descending colon normal. Beginning in the proximal sigmoid colon there is  pericolonic fat stranding adjacent to multiple diverticula. No perforation or abscess. This pericolonic stranding and mild bowel wall inflammation ax occurs over approximately 10 cm segment (image 62/2)   More distal sigmoid colon rectum normal.   No adenopathy in the mesentery.  No free fluid.   Vascular/Lymphatic: Abdominal aorta is normal caliber. No periportal or retroperitoneal adenopathy. No pelvic adenopathy.   Reproductive: Prostate unremarkable   Other: No free fluid.   Musculoskeletal: No aggressive osseous lesion.   IMPRESSION: Acute diverticulitis of the proximal sigmoid colon. No perforation or abscess.     Electronically Signed   By: Genevive Bi M.D.   On: 02/05/2023 15:36     ____________________________  CLINICAL DATA:  Left lower quadrant abdominal pain   EXAM: CT ABDOMEN AND PELVIS WITH CONTRAST   TECHNIQUE: Multidetector CT imaging of the abdomen and pelvis was performed using the standard protocol following bolus administration of intravenous contrast.   RADIATION DOSE REDUCTION: This exam was performed according to the departmental dose-optimization program which includes automated exposure control, adjustment of the mA and/or kV according to patient size and/or use of iterative reconstruction technique.   CONTRAST:  OMNIPAQUE IOHEXOL 300 MG/ML  SOLN   COMPARISON:  10/20/2013   FINDINGS: Lower chest: Included lung bases are clear.  Heart size is normal.   Hepatobiliary: No focal liver abnormality is seen. No gallstones, gallbladder wall thickening, or biliary dilatation.   Pancreas: Stable mild dilation of the main pancreatic duct. No focal parenchymal abnormality. No peripancreatic inflammatory changes.   Spleen: Normal in size without focal abnormality.   Adrenals/Urinary Tract: Unremarkable adrenal glands. Punctate 2 mm nonobstructing stone at the upper pole of the left kidney. Kidneys enhance symmetrically. No solid  lesion or hydronephrosis. Urinary bladder is within normal limits.   Stomach/Bowel: Acute diverticulitis of the mid sigmoid colon containing multiple prominent diverticula. Relatively short segment of bowel wall thickening with surrounding pericolonic fat stranding and trace fluid. There are a few small foci of extraluminal air adjacent to the inflamed segment compatible with micro perforation (series 2, image 57). Multiple fluid-filled, mildly dilated loops of small bowel throughout the abdomen without abrupt transition point, likely a reactive enteritis or ileus. Normal appendix in the right lower quadrant. Small hiatal hernia. Stomach otherwise unremarkable.   Vascular/Lymphatic: Scattered aortoiliac atherosclerotic calcifications without aneurysm. No abdominopelvic lymphadenopathy.   Reproductive: Prostate is unremarkable.   Other: No significant ascites or organized fluid collection. No abdominal wall abnormality.   Musculoskeletal: No acute or significant osseous findings.   IMPRESSION: 1. Acute sigmoid diverticulitis with microperforation. 2. Multiple fluid-filled, mildly dilated loops of small bowel throughout the abdomen without abrupt transition point, likely a reactive enteritis or ileus. 3. Nonobstructing left nephrolithiasis.   Aortic Atherosclerosis (ICD10-I70.0).   These results were called by telephone at the time of interpretation on 03/05/2022 at 2:13 pm to provider Tegler, who verbally acknowledged these results.     Electronically Signed   By: Duanne Guess D.O.   On: 03/05/2022 14:14  _______________  Echocardiogram March 2024 (done for workup of syncope by cardiology):   1. TDS, pt tachycardic during first part of the study. GLS -9.8. Left  ventricular ejection fraction, by estimation, is 55 to 60%. The left  ventricle has normal function. The left ventricle has no regional wall  motion abnormalities. Left ventricular  diastolic parameters are  consistent with Grade I diastolic dysfunction  (impaired relaxation).   2. Right ventricular systolic function is normal. The right ventricular  size is normal.   3. The mitral valve is normal in structure. No evidence of mitral valve  regurgitation. No evidence of mitral stenosis.   4. The aortic valve is normal in structure. Aortic valve regurgitation is  not visualized. No aortic  stenosis is present.   5. The inferior vena cava is normal in size with greater than 50%  respiratory variability, suggesting right atrial pressure of 3 mmHg.    Assessment: No diagnosis found.  ***  Plan:  ***  Thank you for the courtesy of this consult.  Please call me with any questions or concerns.  Charlie Pitter III  CC: Referring provider noted above

## 2023-08-11 NOTE — Progress Notes (Addendum)
Gastroenterology Consult Note:  History: Christian Peterson 08/11/2023  Referring provider: Esperanza Richters, PA-C  Reason for consult/chief complaint: Abdominal Pain (Hx of diverticulitis, recent ED visit, PT says pain is still the same. Using hydrocodone for the pain, hasn't been able to eat due to the pain), Nausea (Intermittent nausea), and Emesis (Intermittent vomiting after eating, dry heaving in the mornings)   Subjective  HPI: Summary of GI issues: Screening colonoscopy with Dr. Myrtie Neither September 20 21-2 subcentimeter and one 10 mm tubular adenoma and sigmoid hyperplastic polyp __________  On this occasion, Wash was referred to [place] after a recent ED visit for abdominal pain.  He went to the ED the day before yesterday for left lower quadrant pain that felt reminiscent of previous episodes of diverticulitis confirmed on CT scans March 2024 and April 2023.  (See CT scan reports below).  The 2023 episode had a microperforation causing some inflammatory changes and possibly regional ileus of adjacent small bowel loops.  Has required hospitalization for IV antibiotics but no surgery.  The episode in March of this year was treated with outpatient antibiotics.   Chart review indicates that there was an episode of presumptive diverticulitis based on symptoms in May of this year that primary care treated with antibiotics. Lab and imaging findings from this week's ED visit noted below, no diverticulitis was seen.  There is a question of some proximal duodenal wall thickening. __________________________  Today, he complains of  lower abdominal pain which he believes is due to a diverticulitis flare up as symptoms are consistent with his previous flare ups.  Feels somewhat nauseated and loses his appetite for several days when episodes occur.  2 episodes of vomiting in the last few months, though not with associated pain episodes.  He states that he has not been eating for the past few  days as he's afraid of triggering any symptoms.  He has been avoiding any nuts or foods that contains any seeds.He has been using hydrocodone from his previous flare up. He denies any fevers, chills, weight loss, blood in stool, and dysphagia.   He reports typically having normal Bms and states that during a flare up, his BM tend to be less frequent; however, he attributes that to not being able to eat normally. He denies any NSAID's usage. He is also experiencing heartburn along with regurgitation and sometimes dry heaves in the mornings that tends to occur 3-4x a week. He typically takes Tums to help relief his symptoms but recently he has been using Pepcid since the ED visit and CT findings noted below.  We also reviewed his diagnosis of diverticulitis and recent ED visit due to a flare up and discussed further recommendation and treatments plans.    ROS:  Review of Systems  Constitutional:  Negative for appetite change and fever.  HENT:  Negative for trouble swallowing.   Respiratory:  Negative for cough and shortness of breath.   Cardiovascular:  Negative for chest pain.  Gastrointestinal:  Positive for abdominal pain, nausea and vomiting. Negative for abdominal distention, anal bleeding, blood in stool, constipation, diarrhea and rectal pain.  Genitourinary:  Negative for dysuria.  Musculoskeletal:  Negative for back pain.  Skin:  Negative for rash.  Neurological:  Negative for weakness.  All other systems reviewed and are negative.    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   Diverticulitis of colon with perforation 03/05/2022   Diverticulitis of intestine with perforation 03/05/2022   Dyspnea on exertion 01/14/2023   Elevated BP without diagnosis of hypertension 03/05/2022   Essential hypertension 01/14/2023   GERD  (gastroesophageal reflux disease)    occ with spicy foods    Heavy alcohol consumption 03/05/2022   Internal prolapsed hemorrhoids 03/05/2022   Syncope and collapse 01/14/2023   Tobacco abuse 03/05/2022   Uncontrolled hypertension 03/05/2022     Past Surgical History: Past Surgical History:  Procedure Laterality Date   ANKLE FRACTURE SURGERY     plate    WISDOM TOOTH EXTRACTION       Family History: Family History  Adopted: Yes    Social History: Social History   Socioeconomic History   Marital status: Married    Spouse name: Not on file   Number of children: Not on file   Years of education: Not on file   Highest education level: Not on file  Occupational History   Not on file  Tobacco Use   Smoking status: Every Day    Current packs/day: 1.00    Types: Cigarettes   Smokeless tobacco: Never  Vaping Use   Vaping status: Every Day  Substance and Sexual Activity   Alcohol use: Yes    Comment: Drinks 5 out of 7 days.  Mostly beer.  Some liquor.   Drug use: Yes    Types: Marijuana    Comment: LAST SMOKED 08/11/20   Sexual activity: Yes  Other Topics Concern   Not on file  Social History Narrative   Are you right handed or left handed? Left handed    Are you currently employed ? no   What is your current occupation? na   Do you live at home alone?no   Who lives with you? Wife and pets   What type of home do you live in: 1 story or 2 story? 1 story 4 or 5 steps        Social Determinants of Corporate investment banker Strain: Not on file  Food Insecurity: Not on file  Transportation Needs: Not on file  Physical Activity: Not on file  Stress: Not on file  Social Connections: Not on file    Allergies: No Known Allergies  Outpatient Meds: Current Outpatient Medications  Medication Sig Dispense Refill   buPROPion (WELLBUTRIN XL) 150 MG 24 hr tablet TAKE 1 TABLET BY MOUTH EVERY DAY 30 tablet 1   hydrALAZINE (APRESOLINE) 25 MG tablet TAKE 1 TABLET BY  MOUTH THREE TIMES A DAY 270 tablet 1   HYDROcodone-acetaminophen (NORCO/VICODIN) 5-325 MG tablet Take 1 tablet by mouth every 6 (six) hours as needed for severe pain. 15 tablet 0   losartan (COZAAR) 100 MG tablet Take 1 tablet (100 mg total) by mouth daily. 90 tablet 3   naproxen (NAPROSYN) 500 MG tablet Take 500 mg by mouth 2 (two) times daily as needed.     famotidine (PEPCID) 20 MG tablet TAKE 1 TABLET BY MOUTH TWICE A DAY (Patient not taking: Reported on 08/11/2023) 60 tablet 0   metoprolol succinate (TOPROL-XL) 50 MG 24 hr tablet Take 2 tablets (100 mg total) by mouth daily. 60 tablet 0   morphine (MSIR) 15 MG tablet Take 0.5 tablets (7.5 mg total) by mouth every 4 (four) hours as needed for severe pain. (Patient not taking: Reported on 08/11/2023) 2 tablet 0   ondansetron (ZOFRAN-ODT) 4 MG disintegrating  tablet 4mg  ODT q4 hours prn nausea/vomit (Patient not taking: Reported on 08/11/2023) 20 tablet 0   senna-docusate (SENOKOT-S) 8.6-50 MG tablet Take 1 tablet by mouth at bedtime as needed for mild constipation or moderate constipation. (Patient not taking: Reported on 08/11/2023) 20 tablet 0   No current facility-administered medications for this visit.    Naproxen noted above appears to have been a previous prescription.  He states he currently does not take aspirin or any known NSAIDs.  ___________________________________________________________________ Objective   Exam:  Ht 5\' 8"  (1.727 m)   Wt 159 lb 4 oz (72.2 kg)   BMI 24.21 kg/m  Wt Readings from Last 3 Encounters:  08/11/23 159 lb 4 oz (72.2 kg)  08/09/23 155 lb (70.3 kg)  06/15/23 161 lb 12.8 oz (73.4 kg)   General: well-appearing, not acutely ill-appearing Eyes: sclera anicteric, no redness ENT: oral mucosa moist without lesions, no cervical or supraclavicular lymphadenopathy CV: RRR, no JVD, no peripheral edema Resp: clear to auscultation bilaterally, normal RR and effort noted GI: soft, very mild LLQ tenderness to deep  palpation, with active bowel sounds. No guarding or palpable organomegaly noted. Skin; warm and dry, no rash or jaundice noted Neuro: awake, alert and oriented x 3. Normal gross motor function and fluent speech,  Labs:     Latest Ref Rng & Units 08/09/2023    9:55 AM 04/06/2023   11:35 AM 02/05/2023    1:23 PM  CBC  WBC 4.0 - 10.5 K/uL 8.0  8.3  11.7   Hemoglobin 13.0 - 17.0 g/dL 53.6  64.4  03.4   Hematocrit 39.0 - 52.0 % 45.7  50.5  50.9   Platelets 150 - 400 K/uL 228  204.0  206       Latest Ref Rng & Units 08/09/2023    9:55 AM 04/06/2023   11:35 AM 02/05/2023    1:23 PM  CMP  Glucose 70 - 99 mg/dL 742  84  595   BUN 6 - 20 mg/dL 13  13  11    Creatinine 0.61 - 1.24 mg/dL 6.38  7.56  4.33   Sodium 135 - 145 mmol/L 135  139  131   Potassium 3.5 - 5.1 mmol/L 4.3  4.3  3.7   Chloride 98 - 111 mmol/L 99  98  96   CO2 22 - 32 mmol/L 25  28  24    Calcium 8.9 - 10.3 mg/dL 9.2  9.5  9.3   Total Protein 6.5 - 8.1 g/dL 7.4  7.4  8.0   Total Bilirubin 0.3 - 1.2 mg/dL 0.7  0.5  0.9   Alkaline Phos 38 - 126 U/L 60  72  90   AST 15 - 41 U/L 35  92  52   ALT 0 - 44 U/L 53  76  52      Radiologic Studies:   CLINICAL DATA:  Acute left lower quadrant abdominal pain.   EXAM: CT ABDOMEN AND PELVIS WITH CONTRAST   TECHNIQUE: Multidetector CT imaging of the abdomen and pelvis was performed using the standard protocol following bolus administration of intravenous contrast.   RADIATION DOSE REDUCTION: This exam was performed according to the departmental dose-optimization program which includes automated exposure control, adjustment of the mA and/or kV according to patient size and/or use of iterative reconstruction technique.   CONTRAST:  OMNIPAQUE IOHEXOL 300 MG/ML  SOLN   COMPARISON:  February 05, 2023.   FINDINGS: Lower chest: No acute abnormality.   Hepatobiliary:  No focal liver abnormality is seen. No gallstones, gallbladder wall thickening, or biliary dilatation.    Pancreas: Unremarkable. No pancreatic ductal dilatation or surrounding inflammatory changes.   Spleen: Normal in size without focal abnormality.   Adrenals/Urinary Tract: Adrenal glands appear normal. Small nonobstructive left renal calculus. No hydronephrosis or renal obstruction is noted. Urinary bladder is unremarkable.   Stomach/Bowel: Stomach is within normal limits. Appendix appears normal. No evidence of bowel obstruction. Wall thickening of proximal duodenum is noted suggesting possible peptic ulcer disease. Sigmoid diverticulosis is noted without inflammation.   Vascular/Lymphatic: Aortic atherosclerosis. No enlarged abdominal or pelvic lymph nodes.   Reproductive: Prostate is unremarkable.   Other: Small fat containing right inguinal hernia.  No ascites.   Musculoskeletal: No acute or significant osseous findings.   IMPRESSION: Moderate wall thickening of proximal duodenum suggesting peptic ulcer disease.   Small nonobstructive left renal calculus.   Sigmoid diverticulosis without inflammation.   Small fat containing right inguinal hernia.   Aortic Atherosclerosis (ICD10-I70.0).     Electronically Signed   By: Lupita Raider M.D.   On: 08/09/2023 12:39    ______________________________________  CLINICAL DATA:  Abdominal pain, history of diverticulitis   EXAM: CT ABDOMEN AND PELVIS WITH CONTRAST   TECHNIQUE: Multidetector CT imaging of the abdomen and pelvis was performed using the standard protocol following bolus administration of intravenous contrast.   RADIATION DOSE REDUCTION: This exam was performed according to the departmental dose-optimization program which includes automated exposure control, adjustment of the mA and/or kV according to patient size and/or use of iterative reconstruction technique.   CONTRAST:  OMNIPAQUE IOHEXOL 300 MG/ML  SOLN   COMPARISON:  None Available.   FINDINGS: Lower chest: Lung bases are clear.    Hepatobiliary: No focal hepatic lesion. Normal gallbladder. No biliary duct dilatation. Common bile duct is normal.   Pancreas: Pancreas is normal. No ductal dilatation. No pancreatic inflammation.   Spleen: Normal spleen   Adrenals/urinary tract: Adrenal glands and kidneys are normal. The ureters and bladder normal.   Stomach/Bowel: Stomach, small bowel, appendix, and cecum are normal. Ascending and transverse colon normal. Descending colon normal. Beginning in the proximal sigmoid colon there is pericolonic fat stranding adjacent to multiple diverticula. No perforation or abscess. This pericolonic stranding and mild bowel wall inflammation ax occurs over approximately 10 cm segment (image 62/2)   More distal sigmoid colon rectum normal.   No adenopathy in the mesentery.  No free fluid.   Vascular/Lymphatic: Abdominal aorta is normal caliber. No periportal or retroperitoneal adenopathy. No pelvic adenopathy.   Reproductive: Prostate unremarkable   Other: No free fluid.   Musculoskeletal: No aggressive osseous lesion.   IMPRESSION: Acute diverticulitis of the proximal sigmoid colon. No perforation or abscess.     Electronically Signed   By: Genevive Bi M.D.   On: 02/05/2023 15:36     ____________________________  CLINICAL DATA:  Left lower quadrant abdominal pain   EXAM: CT ABDOMEN AND PELVIS WITH CONTRAST   TECHNIQUE: Multidetector CT imaging of the abdomen and pelvis was performed using the standard protocol following bolus administration of intravenous contrast.   RADIATION DOSE REDUCTION: This exam was performed according to the departmental dose-optimization program which includes automated exposure control, adjustment of the mA and/or kV according to patient size and/or use of iterative reconstruction technique.   CONTRAST:  OMNIPAQUE IOHEXOL 300 MG/ML  SOLN   COMPARISON:  10/20/2013   FINDINGS: Lower chest: Included lung bases are  clear.  Heart  size is normal.   Hepatobiliary: No focal liver abnormality is seen. No gallstones, gallbladder wall thickening, or biliary dilatation.   Pancreas: Stable mild dilation of the main pancreatic duct. No focal parenchymal abnormality. No peripancreatic inflammatory changes.   Spleen: Normal in size without focal abnormality.   Adrenals/Urinary Tract: Unremarkable adrenal glands. Punctate 2 mm nonobstructing stone at the upper pole of the left kidney. Kidneys enhance symmetrically. No solid lesion or hydronephrosis. Urinary bladder is within normal limits.   Stomach/Bowel: Acute diverticulitis of the mid sigmoid colon containing multiple prominent diverticula. Relatively short segment of bowel wall thickening with surrounding pericolonic fat stranding and trace fluid. There are a few small foci of extraluminal air adjacent to the inflamed segment compatible with micro perforation (series 2, image 57). Multiple fluid-filled, mildly dilated loops of small bowel throughout the abdomen without abrupt transition point, likely a reactive enteritis or ileus. Normal appendix in the right lower quadrant. Small hiatal hernia. Stomach otherwise unremarkable.   Vascular/Lymphatic: Scattered aortoiliac atherosclerotic calcifications without aneurysm. No abdominopelvic lymphadenopathy.   Reproductive: Prostate is unremarkable.   Other: No significant ascites or organized fluid collection. No abdominal wall abnormality.   Musculoskeletal: No acute or significant osseous findings.   IMPRESSION: 1. Acute sigmoid diverticulitis with microperforation. 2. Multiple fluid-filled, mildly dilated loops of small bowel throughout the abdomen without abrupt transition point, likely a reactive enteritis or ileus. 3. Nonobstructing left nephrolithiasis.   Aortic Atherosclerosis (ICD10-I70.0).   These results were called by telephone at the time of interpretation on 03/05/2022 at 2:13 pm to  provider Tegler, who verbally acknowledged these results.     Electronically Signed   By: Duanne Guess D.O.   On: 03/05/2022 14:14  _______________  Echocardiogram March 2024 (done for workup of syncope by cardiology):   1. TDS, pt tachycardic during first part of the study. GLS -9.8. Left  ventricular ejection fraction, by estimation, is 55 to 60%. The left  ventricle has normal function. The left ventricle has no regional wall  motion abnormalities. Left ventricular  diastolic parameters are consistent with Grade I diastolic dysfunction  (impaired relaxation).   2. Right ventricular systolic function is normal. The right ventricular  size is normal.   3. The mitral valve is normal in structure. No evidence of mitral valve  regurgitation. No evidence of mitral stenosis.   4. The aortic valve is normal in structure. Aortic valve regurgitation is  not visualized. No aortic stenosis is present.   5. The inferior vena cava is normal in size with greater than 50%  respiratory variability, suggesting right atrial pressure of 3 mmHg.    Assessment: Acute diverticulitis  Gastroesophageal reflux disease without esophagitis  LLQ pain  Abnormal finding on GI tract imaging  Recurrent diverticulitis with episodes noted above over the last 18 months.  Episode over the last several days is reminiscent of previous episodes by his report.  CT scan without oral contrast with IV contrast does not show any clear diverticulitis.  He has faint tenderness in that area.  No diarrhea bleeding nausea vomiting fever or other symptoms of acute viral GI infection.  I think this may be acute diverticulitis like his previous episodes but just more mild and subradiographic.  I am going to treat him as such with a short course of antibiotics and see if he feels better.  Significance of the duodenal finding on CT scan is uncertain, it may even be an artifact of peristalsis.  He does have frequent reflux  symptoms and needs to make some diet and lifestyle changes including smoking cessation for that to improve.  An upper endoscopy is warranted both to evaluate the finding on CT scan and for the possibility of erosive esophagitis, hiatal hernia, findings of achalasia or anything causing delayed gastric emptying or gastric outlet obstruction.  He is due for colon polyp surveillance with colonoscopy.  At that time, we will naturally be able to evaluate for any other lower GI pathology.  He had some concerns about the possibility of Crohn's disease which I considered to be unlikely but can certainly be evaluated at that time. Plan: -Colonoscopy and EGD.  He was agreeable after discussion of procedure and risks.  The benefits and risks of the planned procedure were described in detail with the patient or (when appropriate) their health care proxy.  Risks were outlined as including, but not limited to, bleeding, infection, perforation, adverse medication reaction leading to cardiac or pulmonary decompensation, pancreatitis (if ERCP).  The limitation of incomplete mucosal visualization was also discussed.  No guarantees or warranties were given.  -Antibiotics  -Flagyl 500 mg for 2x 7 days -Cipro 500 mg for 2x 7 days    Thank you for the courtesy of this consult.  Please call me with any questions or concerns.   - Amada Jupiter, MD    [facility]   Ladona Mow M Kadhim,acting as a scribe for Charlie Pitter III, MD.,have documented all relevant documentation on the behalf of Sherrilyn Rist, MD,as directed by  Sherrilyn Rist, MD while in the presence of Sherrilyn Rist, MD.   Marvis Repress III, MD, have reviewed all documentation for this visit. The documentation on 08/11/23 for the exam, diagnosis, procedures, and orders are all accurate and complete.    CC: Referring provider noted above

## 2023-08-11 NOTE — Patient Instructions (Addendum)
_______________________________________________________  If your blood pressure at your visit was 140/90 or greater, please contact your primary care physician to follow up on this.  _______________________________________________________  If you are age 54 or older, your body mass index should be between 23-30. Your Body mass index is 24.21 kg/m. If this is out of the aforementioned range listed, please consider follow up with your Primary Care Provider.  If you are age 21 or younger, your body mass index should be between 19-25. Your Body mass index is 24.21 kg/m. If this is out of the aformentioned range listed, please consider follow up with your Primary Care Provider.   ________________________________________________________  The [facility] providers would like to encourage you to use [facility] to communicate with providers for non-urgent requests or questions.  Due to long hold times on the telephone, sending your provider a message by [facility] may be a faster and more efficient way to get a response.  Please allow 48 business hours for a response.  Please remember that this is for non-urgent requests.  _______________________________________________________  Bonita Quin have been scheduled for an endoscopy/colonoscopy. Please follow written instructions given to you at your visit today.  If you use inhalers (even only as needed), please bring them with you on the day of your procedure.  If you take any of the following medications, they will need to be adjusted prior to your procedure:   DO NOT TAKE 7 DAYS PRIOR TO TEST- Trulicity (dulaglutide) Ozempic, Wegovy (semaglutide) Mounjaro (tirzepatide) Bydureon Bcise (exanatide extended release)  DO NOT TAKE 1 DAY PRIOR TO YOUR TEST Rybelsus (semaglutide) Adlyxin (lixisenatide) Victoza (liraglutide) Byetta (exanatide) ___________________________________________________________________________  Due to recent changes in healthcare laws, you  may see the results of your imaging and laboratory studies on MyChart before your provider has had a chance to review them.  We understand that in some cases there may be results that are confusing or concerning to you. Not all laboratory results come back in the same time frame and the provider may be waiting for multiple results in order to interpret others.  Please give [place] 48 hours in order for your provider to thoroughly review all the results before contacting the office for clarification of your results.     It was a pleasure to see you today!  Thank you for trusting me with your gastrointestinal care!

## 2023-09-14 ENCOUNTER — Encounter: Payer: Self-pay | Admitting: Gastroenterology

## 2023-09-14 ENCOUNTER — Other Ambulatory Visit: Payer: Self-pay | Admitting: Medical

## 2023-09-14 ENCOUNTER — Ambulatory Visit: Payer: Managed Care, Other (non HMO) | Admitting: Gastroenterology

## 2023-09-14 VITALS — BP 124/88 | HR 92 | Temp 98.4°F | Resp 19 | Ht 68.0 in | Wt 159.0 lb

## 2023-09-14 DIAGNOSIS — Z860100 Personal history of colon polyps, unspecified: Secondary | ICD-10-CM | POA: Diagnosis not present

## 2023-09-14 DIAGNOSIS — D124 Benign neoplasm of descending colon: Secondary | ICD-10-CM

## 2023-09-14 DIAGNOSIS — K2100 Gastro-esophageal reflux disease with esophagitis, without bleeding: Secondary | ICD-10-CM | POA: Diagnosis not present

## 2023-09-14 DIAGNOSIS — Z09 Encounter for follow-up examination after completed treatment for conditions other than malignant neoplasm: Secondary | ICD-10-CM | POA: Diagnosis present

## 2023-09-14 DIAGNOSIS — R112 Nausea with vomiting, unspecified: Secondary | ICD-10-CM

## 2023-09-14 DIAGNOSIS — D122 Benign neoplasm of ascending colon: Secondary | ICD-10-CM

## 2023-09-14 DIAGNOSIS — D128 Benign neoplasm of rectum: Secondary | ICD-10-CM

## 2023-09-14 DIAGNOSIS — K2970 Gastritis, unspecified, without bleeding: Secondary | ICD-10-CM | POA: Diagnosis not present

## 2023-09-14 DIAGNOSIS — K635 Polyp of colon: Secondary | ICD-10-CM | POA: Diagnosis not present

## 2023-09-14 DIAGNOSIS — K219 Gastro-esophageal reflux disease without esophagitis: Secondary | ICD-10-CM

## 2023-09-14 MED ORDER — SODIUM CHLORIDE 0.9 % IV SOLN: 500.0000 mL | Freq: Once | INTRAVENOUS | Status: DC

## 2023-09-14 MED ORDER — PANTOPRAZOLE SODIUM 40 MG PO TBEC: DELAYED_RELEASE_TABLET | ORAL | 2 refills | Status: DC

## 2023-09-14 NOTE — Patient Instructions (Addendum)
Resume previous diet(follow GERD diet) Continue present medications, except stop pepcid and begin taking Pantoprazole Try to stop smoking, discuss with primary care as needed Follow up in GI clinic will be arranged Await pathology results Handouts/information given for gastritis, GERD, esophagitis, hiatal hernia, polyps, diverticulosis and hemorrhoids  YOU HAD AN ENDOSCOPIC PROCEDURE TODAY AT THE [facility]:   Refer to the procedure report that was given to you for any specific questions about what was found during the examination.  If the procedure report does not answer your questions, please call your gastroenterologist to clarify.  If you requested that your care partner not be given the details of your procedure findings, then the procedure report has been included in a sealed envelope for you to review at your convenience later.  YOU SHOULD EXPECT: Some feelings of bloating in the abdomen. Passage of more gas than usual.  Walking can help get rid of the air that was put into your GI tract during the procedure and reduce the bloating. If you had a lower endoscopy (such as a colonoscopy or flexible sigmoidoscopy) you may notice spotting of blood in your stool or on the toilet paper. If you underwent a bowel prep for your procedure, you may not have a normal bowel movement for a few days.  Please Note:  You might notice some irritation and congestion in your nose or some drainage.  This is from the oxygen used during your procedure.  There is no need for concern and it should clear up in a day or so. USE SALINE NASAL SPRAY  SYMPTOMS TO REPORT IMMEDIATELY:  Following lower endoscopy (colonoscopy):  Excessive amounts of blood in the stool  Significant tenderness or worsening of abdominal pains  Swelling of the abdomen that is new, acute  Fever of 100F or higher Following upper endoscopy (EGD)  Vomiting of blood or coffee ground material  New chest pain or pain under the shoulder  blades  Painful or persistently difficult swallowing  New shortness of breath  Black, tarry-looking stools For urgent or emergent issues, a gastroenterologist can be reached at any hour by calling [phone]. Do not use MyChart messaging for urgent concerns.   DIET:  We do recommend a small meal at first, but then you may proceed to your regular diet.  Drink plenty of fluids but you should avoid alcoholic beverages for 24 hours.  ACTIVITY:  You should plan to take it easy for the rest of today and you should NOT DRIVE or use heavy machinery until tomorrow (because of the sedation medicines used during the test).    FOLLOW UP: Our staff will call the number listed on your records the next business day following your procedure.  We will call around 7:15- 8:00 am to check on you and address any questions or concerns that you may have regarding the information given to you following your procedure. If we do not reach you, we will leave a message.     If any biopsies were taken you will be contacted by phone or by letter within the next 1-3 weeks.  Please call us at [phone] if you have not heard about the biopsies in 3 weeks.   SIGNATURES/CONFIDENTIALITY: You and/or your care partner have signed paperwork which will be entered into your electronic medical record.  These signatures attest to the fact that that the information above on your After Visit Summary has been reviewed and is understood.  Full responsibility of the confidentiality of  this discharge information lies with you and/or your care-partner.

## 2023-09-14 NOTE — Op Note (Signed)
[facility] Patient Name: Christian Peterson Procedure Date: 09/14/2023 1:23 PM MRN: [id number] Endoscopist: Sherilyn Cooter L. Myrtie Neither , MD, [id number] Age: 52 Referring MD:  Date of Birth: 05/13/1971 Gender: Male Account #: [id number] Procedure:                Upper GI endoscopy Indications:              Epigastric abdominal pain, Esophageal reflux                            symptoms that recur despite appropriate therapy,                            Nausea with vomiting Medicines:                Monitored Anesthesia Care Procedure:                Pre-Anesthesia Assessment:                           - Prior to the procedure, a History and Physical                            was performed, and patient medications and                            allergies were reviewed. The patient's tolerance of                            previous anesthesia was also reviewed. The risks                            and benefits of the procedure and the sedation                            options and risks were discussed with the patient.                            All questions were answered, and informed consent                            was obtained. Prior Anticoagulants: The patient has                            taken no anticoagulant or antiplatelet agents. ASA                            Grade Assessment: II - A patient with mild systemic                            disease. After reviewing the risks and benefits,                            the patient was deemed in satisfactory condition to  undergo the procedure.                           After obtaining informed consent, the endoscope was                            passed under direct vision. Throughout the                            procedure, the patient's blood pressure, pulse, and                            oxygen saturations were monitored continuously. The                            Olympus Scope SN [id number] was  introduced through the                            mouth, and advanced to the second part of duodenum.                            The upper GI endoscopy was accomplished without                            difficulty. The patient tolerated the procedure                            fairly well. Scope In: Scope Out: Findings:                 The larynx was normal.                           LA Grade C (one or more mucosal breaks continuous                            between tops of 2 or more mucosal folds, less than                            75% circumference) esophagitis with no bleeding was                            found in the distal esophagus.                           A 2-3 cm hiatal hernia was present. This caused                            mild foreshortening and tortuosity of the distal                            esophagus.                           Diffuse inflammation characterized by adherent  blood, congestion (edema), erythema and linear                            erosions was found in the entire examined stomach.                            Biopsies were taken with a cold forceps for                            histology. (Antrum and body on lesser and greater                            curvature, one pathology jar-rule out H. pylori)                           The cardia and gastric fundus were normal on                            retroflexion.                           Patchy erythematous mucosa was found in the                            duodenal bulb.                           The exam of the duodenum was otherwise normal. Complications:            No immediate complications. Estimated Blood Loss:     Estimated blood loss was minimal. Impression:               - Normal larynx.                           - LA Grade C reflux esophagitis with no bleeding.                           - 2-3 cm hiatal hernia.                           - Gastritis.  Biopsied.                           - Erythematous duodenopathy. Recommendation:           - Patient has a contact number available for                            emergencies. The signs and symptoms of potential                            delayed complications were discussed with the                            patient. Return to normal activities tomorrow.  Written discharge instructions were provided to the                            patient.                           - Resume previous diet.                           - Await pathology results.                           - Follow an antireflux regimen.                           - Stop current use of famotidine                           Start pantoprazole 40 mg, 1 tablet twice daily for                            8 weeks, then decrease to once daily                           Dispense #60, refill 2                           Make every effort to stop smoking. This is                            essential for multiple health reasons including,                            but not limited to, control of your GERD.                           Clinic follow-up will be arranged Sherilyn Cooter L. Myrtie Neither, MD 09/14/2023 2:10:46 PM This report has been signed electronically.

## 2023-09-14 NOTE — Progress Notes (Signed)
Vss nad trans to pacu 

## 2023-09-14 NOTE — Progress Notes (Signed)
Called to room to assist during endoscopic procedure.  Patient ID and intended procedure confirmed with present staff. Received instructions for my participation in the procedure from the performing physician.  

## 2023-09-14 NOTE — Progress Notes (Signed)
History and Physical:  This patient presents for endoscopic testing for: Encounter Diagnoses  Name Primary?   Hx of colonic polyps Yes   Gastroesophageal reflux disease without esophagitis     52 year old man here to see me for surveillance colonoscopy with history of polyps as well as for evaluation of upper digestive symptoms.  Those clinical details are outlined in my office consult note of 08/11/2023.  He has had no significant clinical changes since that visit.  Patient is otherwise without complaints or active issues today.   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   Diverticulitis of colon with perforation 03/05/2022   Diverticulitis of intestine with perforation 03/05/2022   Dyspnea on exertion 01/14/2023   Elevated BP without diagnosis of hypertension 03/05/2022   Essential hypertension 01/14/2023   GERD (gastroesophageal reflux disease)    occ with spicy foods    Heavy alcohol consumption 03/05/2022   Internal prolapsed hemorrhoids 03/05/2022   Syncope and collapse 01/14/2023   Tobacco abuse 03/05/2022   Uncontrolled hypertension 03/05/2022     Past Surgical History: Past Surgical History:  Procedure Laterality Date   ANKLE FRACTURE SURGERY     plate    WISDOM TOOTH EXTRACTION      Allergies: No Known Allergies  Outpatient Meds: Current Outpatient Medications  Medication Sig Dispense Refill   buPROPion (WELLBUTRIN XL) 150 MG 24 hr tablet TAKE 1 TABLET BY MOUTH EVERY DAY 30 tablet 1   hydrALAZINE (APRESOLINE) 25 MG tablet TAKE 1 TABLET BY MOUTH THREE TIMES A DAY 270 tablet 1   HYDROcodone-acetaminophen (NORCO/VICODIN) 5-325 MG tablet Take 1 tablet by mouth every 6 (six) hours as needed for severe pain. 15 tablet 0   losartan (COZAAR) 100 MG tablet Take 1 tablet (100 mg total) by mouth daily. 90 tablet 3    metoprolol succinate (TOPROL-XL) 50 MG 24 hr tablet Take 2 tablets (100 mg total) by mouth daily. 60 tablet 0   famotidine (PEPCID) 20 MG tablet TAKE 1 TABLET BY MOUTH TWICE A DAY (Patient not taking: Reported on 08/11/2023) 60 tablet 0   morphine (MSIR) 15 MG tablet Take 0.5 tablets (7.5 mg total) by mouth every 4 (four) hours as needed for severe pain. (Patient not taking: Reported on 08/11/2023) 2 tablet 0   naproxen (NAPROSYN) 500 MG tablet Take 500 mg by mouth 2 (two) times daily as needed. (Patient not taking: Reported on 08/11/2023)     ondansetron (ZOFRAN-ODT) 4 MG disintegrating tablet 4mg  ODT q4 hours prn nausea/vomit (Patient not taking: Reported on 08/11/2023) 20 tablet 0   senna-docusate (SENOKOT-S) 8.6-50 MG tablet Take 1 tablet by mouth at bedtime as needed for mild constipation or moderate constipation. (Patient not taking: Reported on 08/11/2023) 20 tablet 0   tiZANidine (ZANAFLEX) 4 MG tablet Take 4 mg by mouth every 6 (six) hours as needed. (Patient not taking: Reported on 09/14/2023)     Current Facility-Administered Medications  Medication Dose Route Frequency Provider Last Rate Last Admin   0.9 %  sodium chloride infusion  500 mL Intravenous Once Charlie Pitter III, MD          ___________________________________________________________________ Objective   Exam:  BP (!) 149/104   Pulse (!) 113   Temp 98.4 F (36.9 C)   Ht 5\' 8"  (1.727 m)   Wt 159 lb (72.1 kg)  SpO2 98%   BMI 24.18 kg/m   CV: regular , S1/S2 Resp: clear to auscultation bilaterally, normal RR and effort noted GI: soft, no tenderness, with active bowel sounds.   Assessment: Encounter Diagnoses  Name Primary?   Hx of colonic polyps Yes   Gastroesophageal reflux disease without esophagitis      Plan: Colonoscopy EGD  The benefits and risks of the planned procedure were described in detail with the patient or (when appropriate) their health care proxy.  Risks were outlined as including, but  not limited to, bleeding, infection, perforation, adverse medication reaction leading to cardiac or pulmonary decompensation, pancreatitis (if ERCP).  The limitation of incomplete mucosal visualization was also discussed.  No guarantees or warranties were given.  The patient is appropriate for an endoscopic procedure in the ambulatory setting.   - Amada Jupiter, MD

## 2023-09-14 NOTE — Op Note (Signed)
[facility] Patient Name: Christian Peterson Procedure Date: 09/14/2023 1:23 PM MRN: [id number] Endoscopist: Sherilyn Cooter L. Myrtie Neither , MD, [id number] Age: 52 Referring MD:  Date of Birth: [birth year] Gender: Male Account #: [id number] Procedure:                Colonoscopy Indications:              Surveillance: Personal history of adenomatous                            polyps on last colonoscopy 3 years ago                           3 tubular adenomas (1 of which was > 10mm) last                            colonoscopy September 2021                           Patient has also had recurrent diverticulitis Medicines:                Monitored Anesthesia Care Procedure:                Pre-Anesthesia Assessment:                           - Prior to the procedure, a History and Physical                            was performed, and patient medications and                            allergies were reviewed. The patient's tolerance of                            previous anesthesia was also reviewed. The risks                            and benefits of the procedure and the sedation                            options and risks were discussed with the patient.                            All questions were answered, and informed consent                            was obtained. Prior Anticoagulants: The patient has                            taken no anticoagulant or antiplatelet agents. ASA                            Grade Assessment: II - A patient with mild systemic  disease. After reviewing the risks and benefits,                            the patient was deemed in satisfactory condition to                            undergo the procedure.                           After obtaining informed consent, the colonoscope                            was passed under direct vision. Throughout the                            procedure, the patient's blood pressure, pulse, and                             oxygen saturations were monitored continuously. The                            Olympus [id number] ([id number]) Colonoscope was                            introduced through the anus and advanced to the the                            cecum, identified by appendiceal orifice and                            ileocecal valve. The colonoscopy was performed                            without difficulty. The patient tolerated the                            procedure (albeit with frequent coughing). The                            quality of the bowel preparation was good. The                            ileocecal valve, appendiceal orifice, and rectum                            were photographed. Scope In: 1:40:33 PM Scope Out: 2:01:17 PM Scope Withdrawal Time: 0 hours 14 minutes 30 seconds  Total Procedure Duration: 0 hours 20 minutes 44 seconds  Findings:                 The perianal and digital rectal examinations were                            normal.  Repeat examination of right colon under NBI                            performed.                           Multiple diverticula were found in the left colon.                            There was patchy erythema in this area as well.                           Two sessile polyps were found in the descending                            colon and ascending colon. The polyps were 3 to 5                            mm in size. These polyps were removed with a cold                            snare. Resection and retrieval were complete.                           A 4 mm polyp was found in the distal rectum. The                            polyp was sessile. The polyp was removed with a                            cold snare. Resection and retrieval were complete.                           Internal hemorrhoids were found.                           The exam was otherwise without abnormality on                             direct and retroflexion views. Complications:            No immediate complications. Estimated Blood Loss:     Estimated blood loss was minimal. Impression:               - Diverticulosis in the left colon.                           - Two 3 to 5 mm polyps in the descending colon and                            in the ascending colon, removed with a cold snare.                            Resected and retrieved.                           -  One 4 mm polyp in the distal rectum, removed with                            a cold snare. Resected and retrieved.                           - Internal hemorrhoids.                           - The examination was otherwise normal on direct                            and retroflexion views. Recommendation:           - Patient has a contact number available for                            emergencies. The signs and symptoms of potential                            delayed complications were discussed with the                            patient. Return to normal activities tomorrow.                            Written discharge instructions were provided to the                            patient.                           - Resume previous diet.                           - Continue present medications.                           - Await pathology results.                           - Repeat colonoscopy is recommended for                            surveillance. The colonoscopy date will be                            determined after pathology results from today's                            exam become available for review.                           - See the other procedure note for documentation of                            additional  recommendations. Analilia Geddis L. Myrtie Neither, MD 09/14/2023 2:15:47 PM This report has been signed electronically.

## 2023-09-15 ENCOUNTER — Telehealth: Payer: Self-pay

## 2023-09-15 NOTE — Telephone Encounter (Signed)
Left message on follow up call. 

## 2023-09-17 LAB — SURGICAL PATHOLOGY

## 2023-09-20 ENCOUNTER — Encounter: Payer: Self-pay | Admitting: Gastroenterology

## 2023-10-08 ENCOUNTER — Other Ambulatory Visit: Payer: Self-pay | Admitting: Medical

## 2023-10-13 ENCOUNTER — Other Ambulatory Visit: Payer: Self-pay | Admitting: Medical

## 2023-11-11 ENCOUNTER — Other Ambulatory Visit: Payer: Self-pay | Admitting: Medical

## 2023-11-12 ENCOUNTER — Other Ambulatory Visit: Payer: Self-pay

## 2023-11-12 MED ORDER — LOSARTAN POTASSIUM 100 MG PO TABS: 100.0000 mg | ORAL_TABLET | Freq: Every day | ORAL | 0 refills | Status: DC

## 2023-11-18 ENCOUNTER — Ambulatory Visit: Payer: Managed Care, Other (non HMO) | Admitting: Gastroenterology

## 2023-12-10 ENCOUNTER — Other Ambulatory Visit: Payer: Self-pay

## 2023-12-10 ENCOUNTER — Telehealth: Payer: Self-pay | Admitting: Gastroenterology

## 2023-12-13 NOTE — Telephone Encounter (Signed)
Lm on vm for patient to return call

## 2023-12-13 NOTE — Telephone Encounter (Signed)
This can be refilled at a dose of 40 mg taken once daily.  Dispense 90 tablets, 0 refill  Last patient contact was EGD October 2024, and he needs follow-up to discuss his GERD and erosive esophagitis.  Needs next available clinic visit with me or APP.  When that is done, please refill the med.  Ellwood Dense MD

## 2023-12-15 ENCOUNTER — Other Ambulatory Visit: Payer: Self-pay

## 2023-12-15 MED ORDER — PANTOPRAZOLE SODIUM 40 MG PO TBEC: 40.0000 mg | DELAYED_RELEASE_TABLET | Freq: Every day | ORAL | 0 refills | Status: DC

## 2023-12-15 NOTE — Telephone Encounter (Signed)
Pt scheduled to see Noel Gerold -Smith NP 12/20/23 at 8:30am. Script sent in for protonix as ordered by Dr. Myrtie Neither.

## 2023-12-20 ENCOUNTER — Encounter: Payer: Self-pay | Admitting: Nurse Practitioner

## 2023-12-20 ENCOUNTER — Ambulatory Visit (INDEPENDENT_AMBULATORY_CARE_PROVIDER_SITE_OTHER): Payer: Managed Care, Other (non HMO) | Admitting: Nurse Practitioner

## 2023-12-20 VITALS — BP 190/110 | HR 105 | Ht 62.0 in | Wt 171.2 lb

## 2023-12-20 DIAGNOSIS — K449 Diaphragmatic hernia without obstruction or gangrene: Secondary | ICD-10-CM

## 2023-12-20 DIAGNOSIS — K219 Gastro-esophageal reflux disease without esophagitis: Secondary | ICD-10-CM

## 2023-12-20 DIAGNOSIS — K2100 Gastro-esophageal reflux disease with esophagitis, without bleeding: Secondary | ICD-10-CM

## 2023-12-20 DIAGNOSIS — Z860101 Personal history of adenomatous and serrated colon polyps: Secondary | ICD-10-CM

## 2023-12-20 DIAGNOSIS — F17210 Nicotine dependence, cigarettes, uncomplicated: Secondary | ICD-10-CM

## 2023-12-20 DIAGNOSIS — I10 Essential (primary) hypertension: Secondary | ICD-10-CM

## 2023-12-20 DIAGNOSIS — Z8719 Personal history of other diseases of the digestive system: Secondary | ICD-10-CM

## 2023-12-20 DIAGNOSIS — K2970 Gastritis, unspecified, without bleeding: Secondary | ICD-10-CM | POA: Diagnosis not present

## 2023-12-20 DIAGNOSIS — F1090 Alcohol use, unspecified, uncomplicated: Secondary | ICD-10-CM

## 2023-12-20 NOTE — Patient Instructions (Addendum)
Continue Pantoprazole 40 mg daily.  Reduce alcohol intake.  Smoking cessation is recommended.  Contact your primary care doctor today regarding elevated blood pressure. Take home blood pressure and record and then take high blood pressure medications and repeat blood pressure reading in 1 hour.  Follow up with your primary care doctor for hepatic panel in 01/2024.  Go to the emergency department if you develop severe headache, dizziness, shortness of breath or chest pain.  Due to recent changes in healthcare laws, you may see the results of your imaging and laboratory studies on MyChart before your provider has had a chance to review them.  We understand that in some cases there may be results that are confusing or concerning to you. Not all laboratory results come back in the same time frame and the provider may be waiting for multiple results in order to interpret others.  Please give [place] 48 hours in order for your provider to thoroughly review all the results before contacting the office for clarification of your results.   Thank you for trusting me with your gastrointestinal care!   Alcide Evener, CRNP

## 2023-12-20 NOTE — Progress Notes (Signed)
12/20/2023 Christian Peterson [id number] Oct 04, 1971   Chief Complaint: GERD follow-up  History of Present Illness: Christian Peterson. Christian Peterson is a 53 year old male with a past medical history of hypertension, aortic athersclerosis, GERD, diverticulitis and colon polyps. He is known by Dr. Myrtie Neither.  He underwent an EGD 09/14/2023 which showed grade C reflux esophagitis, a 2 to 3 cm hiatal hernia, gastritis and erythematous duodenopathy. Biopsies were negative for H. pylori.  He was prescribed Pantoprazole 40 mg twice daily for 8 weeks then once daily.  He denies having any dysphagia or heartburn.  No upper or lower abdominal pain.  He is passing normal formed brown bowel movement daily.  No rectal bleeding or black stools.  He rarely takes Tylenol for back pain.  No NSAID use.  He smokes cigarettes 1 pack/day.  He drinks 2 cups of coffee daily.  He drinks 2-3 beers 5 days weekly and on the weekends he drinks 5-6 beers per night.  He and his wife are going to [place] and will be on a cruise for few weeks 01/2024 and he questions what to do if he develops diverticulitis symptoms during his time of travel.  His last episode of diverticulitis confirmed by CT was 01/2023, resolved after completing Augmentin x 10 days. His most recent colonoscopy was 09/14/2023 which showed diverticulosis in the left colon and 3 tubular adenomatous polyps were removed from the colon.  He was advised to repeat a colonoscopy in 3 years.  His blood pressure was markedly elevated in office this morning 180/118 with heart rate 110.  Repeat BP 190/110.  He did not take hydralazine, losartan or metoprolol this morning because he did not eat breakfast prior to this appointment, routinely takes his medications with food.  He drank 2 cups of coffee this morning.  He denies having any chest pain, dizziness, severe headache or shortness of breath.      Latest Ref Rng & Units 08/09/2023    9:55 AM 04/06/2023   11:35 AM 02/05/2023    1:23 PM  CBC   WBC 4.0 - 10.5 K/uL 8.0  8.3  11.7   Hemoglobin 13.0 - 17.0 g/dL 40.9  81.1  91.4   Hematocrit 39.0 - 52.0 % 45.7  50.5  50.9   Platelets 150 - 400 K/uL 228  204.0  206         Latest Ref Rng & Units 08/09/2023    9:55 AM 04/06/2023   11:35 AM 02/05/2023    1:23 PM  CMP  Glucose 70 - 99 mg/dL 782  84  956   BUN 6 - 20 mg/dL 13  13  11    Creatinine 0.61 - 1.24 mg/dL 2.13  0.86  5.78   Sodium 135 - 145 mmol/L 135  139  131   Potassium 3.5 - 5.1 mmol/L 4.3  4.3  3.7   Chloride 98 - 111 mmol/L 99  98  96   CO2 22 - 32 mmol/L 25  28  24    Calcium 8.9 - 10.3 mg/dL 9.2  9.5  9.3   Total Protein 6.5 - 8.1 g/dL 7.4  7.4  8.0   Total Bilirubin 0.3 - 1.2 mg/dL 0.7  0.5  0.9   Alkaline Phos 38 - 126 U/L 60  72  90   AST 15 - 41 U/L 35  92  52   ALT 0 - 44 U/L 53  76  52     CTAP 08/09/2023 with  contrast: FINDINGS: Lower chest: No acute abnormality.   Hepatobiliary: No focal liver abnormality is seen. No gallstones, gallbladder wall thickening, or biliary dilatation.   Pancreas: Unremarkable. No pancreatic ductal dilatation or surrounding inflammatory changes.   Spleen: Normal in size without focal abnormality.   Adrenals/Urinary Tract: Adrenal glands appear normal. Small nonobstructive left renal calculus. No hydronephrosis or renal obstruction is noted. Urinary bladder is unremarkable.   Stomach/Bowel: Stomach is within normal limits. Appendix appears normal. No evidence of bowel obstruction. Wall thickening of proximal duodenum is noted suggesting possible peptic ulcer disease. Sigmoid diverticulosis is noted without inflammation.   Vascular/Lymphatic: Aortic atherosclerosis. No enlarged abdominal or pelvic lymph nodes.   Reproductive: Prostate is unremarkable.   Other: Small fat containing right inguinal hernia.  No ascites.   Musculoskeletal: No acute or significant osseous findings.   IMPRESSION: Moderate wall thickening of proximal duodenum suggesting peptic ulcer  disease.   Small nonobstructive left renal calculus.   Sigmoid diverticulosis without inflammation.   Small fat containing right inguinal hernia.   Aortic Atherosclerosis.   CTAP 02/05/2023: FINDINGS: Lower chest: Lung bases are clear.   Hepatobiliary: No focal hepatic lesion. Normal gallbladder. No biliary duct dilatation. Common bile duct is normal.   Pancreas: Pancreas is normal. No ductal dilatation. No pancreatic inflammation.   Spleen: Normal spleen   Adrenals/urinary tract: Adrenal glands and kidneys are normal. The ureters and bladder normal.   Stomach/Bowel: Stomach, small bowel, appendix, and cecum are normal. Ascending and transverse colon normal. Descending colon normal. Beginning in the proximal sigmoid colon there is pericolonic fat stranding adjacent to multiple diverticula. No perforation or abscess. This pericolonic stranding and mild bowel wall inflammation ax occurs over approximately 10 cm segment (image 62/2)   More distal sigmoid colon rectum normal.   No adenopathy in the mesentery.  No free fluid.   Vascular/Lymphatic: Abdominal aorta is normal caliber. No periportal or retroperitoneal adenopathy. No pelvic adenopathy.   Reproductive: Prostate unremarkable   Other: No free fluid.   Musculoskeletal: No aggressive osseous lesion.   IMPRESSION: Acute diverticulitis of the proximal sigmoid colon. No perforation or abscess.   ECHO 01/29/2023: IMPRESSIONS TDS, pt tachycardic during first part of the study. GLS -9.8. Left ventricular ejection fraction, by estimation, is 55 to 60%. The left ventricle has normal function. The left ventricle has no regional wall motion abnormalities. Left ventricular diastolic parameters are consistent with Grade I diastolic dysfunction (impaired relaxation). 1. 2. Right ventricular systolic function is normal. The right ventricular size is normal. The mitral valve is normal in structure. No evidence of mitral valve  regurgitation. No evidence of mitral stenosis. 3. The aortic valve is normal in structure. Aortic valve regurgitation is not visualized. No aortic stenosis is present. 4. The inferior vena cava is normal in size with greater than 50% respiratory variability, suggesting right atrial pressure of 3 mmHg.  PAST GI PROCEDURES:   EGD 09/14/2023: - Normal larynx.  - LA Grade C reflux esophagitis with no bleeding - 2-3 cm hiatal hernia.  - Gastritis. Biopsied.  - Erythematous duodenopathy. - Path report negative for H. Pylori  -Treated with PPI bid x 8 weeks then every day  Colonoscopy 09/14/2023: - Diverticulosis in the left colon. - Two 3 to 5 mm polyps in the descending colon and in the ascending colon, removed with a cold snare. Resected and retrieved.  - One 4 mm polyp in the distal rectum, removed with a cold snare. Resected and retrieved.  - Internal hemorrhoids.  -  The examination was otherwise normal on direct and retroflexion views.  - Path report showed tubular adenomatous polyps  - Recall colonoscopy 3 years   Colonoscopy 08/13/2020: - Three 4 to 10 mm polyps in the distal transverse colon and in the ascending colon, removed with a cold snare. Resected and retrieved.  - One 8 mm polyp in the mid sigmoid colon, removed with a hot snare. Resected and retrieved.  - Diverticulosis in the left colon.  - Internal hemorrhoids.  - The examination was otherwise normal on direct and retroflexion views.  1. Surgical [P], colon, transverse-2 and ascending, polyp (3) - TUBULAR ADENOMA, NEGATIVE FOR HIGH GRADE DYSPLASIA (X MULTIPLE). 2. Surgical [P], colon, sigmoid, polyp - HYPERPLASTIC POLYP.  Current Outpatient Medications on File Prior to Visit  Medication Sig Dispense Refill   buPROPion (WELLBUTRIN XL) 150 MG 24 hr tablet TAKE 1 TABLET BY MOUTH EVERY DAY 30 tablet 1   hydrALAZINE (APRESOLINE) 25 MG tablet TAKE 1 TABLET BY MOUTH THREE TIMES A DAY 270 tablet 1   losartan (COZAAR) 100 MG  tablet Take 1 tablet (100 mg total) by mouth daily. 90 tablet 0   metoprolol succinate (TOPROL-XL) 50 MG 24 hr tablet Take 2 tablets (100 mg total) by mouth daily. 180 tablet 0   pantoprazole (PROTONIX) 40 MG tablet Take 1 tablet (40 mg total) by mouth daily. 90 tablet 0   HYDROcodone-acetaminophen (NORCO/VICODIN) 5-325 MG tablet Take 1 tablet by mouth every 6 (six) hours as needed for severe pain. (Patient not taking: Reported on 12/20/2023) 15 tablet 0   morphine (MSIR) 15 MG tablet Take 0.5 tablets (7.5 mg total) by mouth every 4 (four) hours as needed for severe pain. (Patient not taking: Reported on 12/20/2023) 2 tablet 0   naproxen (NAPROSYN) 500 MG tablet Take 500 mg by mouth 2 (two) times daily as needed. (Patient not taking: Reported on 12/20/2023)     ondansetron (ZOFRAN-ODT) 4 MG disintegrating tablet 4mg  ODT q4 hours prn nausea/vomit (Patient not taking: Reported on 12/20/2023) 20 tablet 0   senna-docusate (SENOKOT-S) 8.6-50 MG tablet Take 1 tablet by mouth at bedtime as needed for mild constipation or moderate constipation. (Patient not taking: Reported on 12/20/2023) 20 tablet 0   tiZANidine (ZANAFLEX) 4 MG tablet Take 4 mg by mouth every 6 (six) hours as needed. (Patient not taking: Reported on 12/20/2023)     No current facility-administered medications on file prior to visit.   No Known Allergies  Current Medications, Allergies, Past Medical History, Past Surgical History, Family History and Social History were reviewed in [employer] record.  Review of Systems:   Constitutional: Negative for fever, sweats, chills or weight loss.  Respiratory: Negative for shortness of breath.   Cardiovascular: Negative for chest pain, palpitations and leg swelling.  Gastrointestinal: See HPI.  Musculoskeletal: Negative for back pain or muscle aches.  Neurological: Negative for dizziness, headaches or paresthesias.   Physical Exam: BP (!) 190/110   Pulse (!) 105   Ht 5\' 2"   (1.575 m)   Wt 171 lb 4 oz (77.7 kg)   BMI 31.32 kg/m   General: 53 year old male in no acute distress. Head: Normocephalic and atraumatic. Eyes: No scleral icterus. Conjunctiva pink . Ears: Normal auditory acuity. Mouth: Dentition intact. No ulcers or lesions.  Lungs: Clear throughout to auscultation. Heart: Tachycardic, no murmur. Abdomen: Soft, nontender and nondistended. No masses or hepatomegaly. Normal bowel sounds x 4 quadrants.  Rectal: Deferred.  Musculoskeletal: Symmetrical with no gross deformities.  Extremities: No edema. Neurological: Alert oriented x 4. No focal deficits.  Psychological: Alert and cooperative. Normal mood and affect  Assessment and Recommendations:  53 year old male with GERD. EGD 09/14/2023 which showed grade C reflux esophagitis, a 2 to 3 cm hiatal hernia, gastritis and erythematous duodenopathy. Biopsies negative for H. pylori.  Treated with pantoprazole 40 mg twice daily x 8 weeks then daily.  No dysphagia, heartburn or abdominal pain.   -Continue Pantoprazole 40 mg daily -Reduce coffee and alcohol intake -GERD diet -Avoid NSAIDs  History of colon polyps. Colonoscopy was 09/14/2023 identified 3 tubular adenomatous polyps were removed from the colon.  -Next colonoscopy due 08/2026  History of diverticulitis.  His most recent documented episode of diverticulitis per CT was 02/05/2023, resolved after completing a course of Augmentin.  Patient is traveling to [place] and will be on a cruise for few weeks and questions what to do if he develops diverticulitis symptoms during his time of travel. -Patient instructed to avoid constipation, take MiraLAX nightly as needed -Drink 64 ounces of water daily -Fiber diet as tolerated -Patient to call me 1 week prior to his departure and I will send a prescription of Augmentin to his pharmacy for him to carry during his time of travel.  However, patient was instructed to seek health care care on the cruise for due  to the local ED if accessible for further evaluation if he develops lower abdominal pain during his time of travel.  Hypertension. BP this am 180/118 with heart rate 110.  Repeat BP 190/110.  Patient is asymptomatic.  He did not take Hydralazine, Metoprolol for Losartan this morning prior to his appointment. -Patient was instructed to go to the emergency room if he develops chest pain, shortness of breath, dizziness, or severe headache -Patient instructed to contact his PCP today regarding significant hypertension -Patient instructed to take BP once he arrives home, then take routine antihypertensive medications and repeat BP in 1 hour and provide PCP with results   -Chronic tobacco use -Smoking cessation encouraged  Alcohol overuse.  History of mildly elevated LFTs.  CTAP 07/2023 showed a normal liver. -Patient encouraged to reduce alcohol intake, at risk for alcohol associated liver disease

## 2023-12-22 ENCOUNTER — Other Ambulatory Visit: Payer: Self-pay | Admitting: Medical

## 2023-12-31 ENCOUNTER — Encounter: Payer: Self-pay | Admitting: Medical

## 2023-12-31 ENCOUNTER — Ambulatory Visit (INDEPENDENT_AMBULATORY_CARE_PROVIDER_SITE_OTHER): Payer: Managed Care, Other (non HMO) | Admitting: Medical

## 2023-12-31 VITALS — BP 158/80 | HR 92 | Resp 18 | Ht 62.0 in | Wt 173.8 lb

## 2023-12-31 DIAGNOSIS — R739 Hyperglycemia, unspecified: Secondary | ICD-10-CM

## 2023-12-31 DIAGNOSIS — K219 Gastro-esophageal reflux disease without esophagitis: Secondary | ICD-10-CM | POA: Diagnosis not present

## 2023-12-31 DIAGNOSIS — I10 Essential (primary) hypertension: Secondary | ICD-10-CM | POA: Diagnosis not present

## 2023-12-31 DIAGNOSIS — F17200 Nicotine dependence, unspecified, uncomplicated: Secondary | ICD-10-CM | POA: Diagnosis not present

## 2023-12-31 DIAGNOSIS — E875 Hyperkalemia: Secondary | ICD-10-CM

## 2023-12-31 LAB — HEMOGLOBIN A1C: Hgb A1c MFr Bld: 5.5 % (ref 4.6–6.5)

## 2023-12-31 LAB — COMPREHENSIVE METABOLIC PANEL
ALT: 55 U/L — ABNORMAL HIGH (ref 0–53)
AST: 48 U/L — ABNORMAL HIGH (ref 0–37)
Albumin: 4.6 g/dL (ref 3.5–5.2)
Alkaline Phosphatase: 72 U/L (ref 39–117)
BUN: 13 mg/dL (ref 6–23)
CO2: 29 meq/L (ref 19–32)
Calcium: 9.6 mg/dL (ref 8.4–10.5)
Chloride: 99 meq/L (ref 96–112)
Creatinine, Ser: 0.79 mg/dL (ref 0.40–1.50)
GFR: 100.75 mL/min (ref >60.00)
Glucose, Bld: 103 mg/dL — ABNORMAL HIGH (ref 70–99)
Potassium: 5.2 meq/L — ABNORMAL HIGH (ref 3.5–5.1)
Sodium: 137 meq/L (ref 135–145)
Total Bilirubin: 0.6 mg/dL (ref 0.2–1.2)
Total Protein: 7.8 g/dL (ref 6.0–8.3)

## 2023-12-31 MED ORDER — LOSARTAN POTASSIUM 100 MG PO TABS: 100.0000 mg | ORAL_TABLET | Freq: Every day | ORAL | 3 refills | Status: AC

## 2023-12-31 MED ORDER — BUPROPION HCL ER (XL) 150 MG PO TB24: 150.0000 mg | ORAL_TABLET | Freq: Every day | ORAL | 3 refills | Status: AC

## 2023-12-31 NOTE — Progress Notes (Signed)
Subjective:    Patient ID: Christian Peterson, male    DOB: May 03, 1971, 53 y.o.   MRN: [id number]  HPI Pt last seen this summer.  Pt updates me that he has seen his GI MD for colonoscopy and had egd.  Pt was placed on pantoprazole for gerd symptoms. This has helped a lot.  Hx of diverticulitis. Pt GI MD office will make antibiotic available to use in event he were to get flare on upcoming cruise. Pt wants to avoid having to see med provider on the ship.   Pt has htn. He is on losartan 100 mg daily. Toprol xl 100 mg daily and hydralazine 25 mg three times daily. Bp was high at GI MD office but he did not take his bp med that day. Today he tooks bp med at 9 am. Drank 2 cups of coffee today.  Pt also smoker and he is on Wellbutrin XL. He states he has been smoking less.      Review of Systems  Constitutional:  Negative for chills, fatigue and fever.  HENT:  Negative for congestion and ear pain.   Respiratory:  Negative for cough, chest tightness, shortness of breath and wheezing.   Cardiovascular:  Negative for chest pain and palpitations.  Gastrointestinal:  Negative for abdominal pain.  Musculoskeletal:  Negative for back pain.  Neurological:  Negative for dizziness, syncope, speech difficulty, weakness and light-headedness.  Hematological:  Negative for adenopathy.  Psychiatric/Behavioral:  Negative for behavioral problems, decreased concentration and dysphoric mood. The patient is not nervous/anxious.    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   Diverticulitis of colon with perforation 03/05/2022   Diverticulitis of intestine with perforation 03/05/2022   Dyspnea on exertion 01/14/2023   Elevated BP without diagnosis of hypertension 03/05/2022   Essential hypertension 01/14/2023   GERD (gastroesophageal reflux disease)    occ with spicy foods    Heavy  alcohol consumption 03/05/2022   Internal prolapsed hemorrhoids 03/05/2022   Syncope and collapse 01/14/2023   Tobacco abuse 03/05/2022   Uncontrolled hypertension 03/05/2022     Social History   Socioeconomic History   Marital status: Married    Spouse name: Not on file   Number of children: Not on file   Years of education: Not on file   Highest education level: Not on file  Occupational History   Not on file  Tobacco Use   Smoking status: Every Day    Current packs/day: 1.00    Types: Cigarettes   Smokeless tobacco: Never  Vaping Use   Vaping status: Former  Substance and Sexual Activity   Alcohol use: Yes    Comment: Drinks 5 out of 7 days.  Mostly beer.  Some liquor.   Drug use: Yes    Types: Marijuana    Comment: within the past week   Sexual activity: Yes  Other Topics Concern   Not on file  Social History Narrative   Are you right handed or left handed? Left handed    Are you currently employed ? no   What is your current occupation? na   Do you live at home alone?no   Who lives with you? Wife and pets   What type of home do you live in: 1 story or 2 story? 1 story 4 or 5 steps  Social Drivers of Corporate investment banker Strain: Not on file  Food Insecurity: Not on file  Transportation Needs: Not on file  Physical Activity: Not on file  Stress: Not on file  Social Connections: Not on file  Intimate Partner Violence: Not on file    Past Surgical History:  Procedure Laterality Date   ANKLE FRACTURE SURGERY     plate    WISDOM TOOTH EXTRACTION      Family History  Adopted: Yes    No Known Allergies  Current Outpatient Medications on File Prior to Visit  Medication Sig Dispense Refill   buPROPion (WELLBUTRIN XL) 150 MG 24 hr tablet TAKE 1 TABLET BY MOUTH EVERY DAY 30 tablet 1   hydrALAZINE (APRESOLINE) 25 MG tablet TAKE 1 TABLET BY MOUTH THREE TIMES A DAY 270 tablet 1   HYDROcodone-acetaminophen (NORCO/VICODIN) 5-325 MG tablet Take 1  tablet by mouth every 6 (six) hours as needed for severe pain. (Patient not taking: Reported on 12/20/2023) 15 tablet 0   metoprolol succinate (TOPROL-XL) 50 MG 24 hr tablet Take 2 tablets (100 mg total) by mouth daily. 180 tablet 0   morphine (MSIR) 15 MG tablet Take 0.5 tablets (7.5 mg total) by mouth every 4 (four) hours as needed for severe pain. (Patient not taking: Reported on 12/20/2023) 2 tablet 0   naproxen (NAPROSYN) 500 MG tablet Take 500 mg by mouth 2 (two) times daily as needed. (Patient not taking: Reported on 12/20/2023)     ondansetron (ZOFRAN-ODT) 4 MG disintegrating tablet 4mg  ODT q4 hours prn nausea/vomit (Patient not taking: Reported on 12/20/2023) 20 tablet 0   pantoprazole (PROTONIX) 40 MG tablet Take 1 tablet (40 mg total) by mouth daily. 90 tablet 0   senna-docusate (SENOKOT-S) 8.6-50 MG tablet Take 1 tablet by mouth at bedtime as needed for mild constipation or moderate constipation. (Patient not taking: Reported on 12/20/2023) 20 tablet 0   tiZANidine (ZANAFLEX) 4 MG tablet Take 4 mg by mouth every 6 (six) hours as needed. (Patient not taking: Reported on 12/20/2023)     No current facility-administered medications on file prior to visit.    BP (!) 158/80   Pulse 92   Resp 18   Ht 5\' 2"  (1.575 m)   Wt 173 lb 12.8 oz (78.8 kg)   SpO2 98%   BMI 31.79 kg/m           Objective:   Physical Exam General Mental Status- Alert. General Appearance- Not in acute distress.   Skin General: Color- Normal Color. Moisture- Normal Moisture.  Neck Carotid Arteries- Normal color. Moisture- Normal Moisture. No carotid bruits. No JVD.  Chest and Lung Exam Auscultation: Breath Sounds:-Normal.  Cardiovascular Auscultation:Rythm- Regular. Murmurs & Other Heart Sounds:Auscultation of the heart reveals- No Murmurs.  Abdomen Inspection:-Inspeection Normal. Palpation/Percussion:Note:No mass. Palpation and Percussion of the abdomen reveal- Non Tender, Non Distended + BS, no rebound or  guarding.   Neurologic Cranial Nerve exam:- CN III-XII intact(No nystagmus), symmetric smile. Strength:- 5/5 equal and symmetric strength both upper and lower extremities.        Assessment & Plan:  1. Essential hypertension (Primary) -continue losartan 100 mg daily, toprol xl 100 mg daily and increase your hydralazine to 50 mg three times daily. Take 2 of your 25 mg tabs.   2. Smoker -refilled wellbutrin tab to help further decrease smoking or stop all together.  3. Gastroesophageal reflux disease without esophagitis  -Advise healthy diet and continue pantoprazole.  4. GI MD giving  antibiotic for you to have in event diverticulitis flare while on cruise.  -offered scopolamine patch but declined.   Declined shingrix vaccine ad pneumonia vaccine today. Can get later.   Ask you to check bp at home daily and update me on readigs in one week. Follow up date would be determined after bp update.  Esperanza Richters, PA-C

## 2023-12-31 NOTE — Patient Instructions (Addendum)
1. Essential hypertension (Primary) -continue losartan 100 mg daily, toprol xl 100 mg daily and increase your hydralazine to 50 mg three times daily. Take 2 of your 25 mg tabs.    2. Smoker -refilled wellbutrin tab to help further decrease smoking or stop all together.  3. Gastroesophageal reflux disease without esophagitis  -Advise healthy diet and continue pantoprazole.  4. GI MD giving antibiotic for you to have in event diverticulitis flare while on cruise.  -offered scopolamine patch but declined.   Declined shingrix vaccine ad pneumonia vaccine today. Can get later.   Ask you to check bp at home daily and update me on readigs in one week. Follow up date would be determined after bp update.

## 2024-01-01 NOTE — Addendum Note (Signed)
Addended by: Gwenevere Abbot on: 01/01/2024 08:10 AM   Modules accepted: Orders

## 2024-01-08 ENCOUNTER — Other Ambulatory Visit: Payer: Self-pay | Admitting: Medical

## 2024-01-10 ENCOUNTER — Other Ambulatory Visit: Payer: Self-pay | Admitting: Cardiology

## 2024-02-02 ENCOUNTER — Telehealth: Payer: Self-pay | Admitting: Nurse Practitioner

## 2024-02-02 NOTE — Telephone Encounter (Signed)
 Patient stated that he is currently leaving to go out of town Saturday morning to [place] and then go on a cruise ship. Patient is requesting that we prescribe him some antibiotic in the case his diverticulitis flare up. Patient is requesting a call back. Please advise.

## 2024-02-02 NOTE — Telephone Encounter (Signed)
 Dr. Myrtie Neither please see the note below. Pt last saw Jill Side but she is out all week. Pt has hx of diverticulitis and is going on a cruise. Requesting a script for antibiotics in case he has a flare while on trip. Please advise.

## 2024-02-03 ENCOUNTER — Other Ambulatory Visit: Payer: Self-pay

## 2024-02-03 ENCOUNTER — Emergency Department (HOSPITAL_BASED_OUTPATIENT_CLINIC_OR_DEPARTMENT_OTHER)
Admission: EM | Admit: 2024-02-03 | Discharge: 2024-02-03 | Disposition: A | Discharge status: Home / Self Care | Attending: Emergency Medicine | Admitting: Emergency Medicine

## 2024-02-03 ENCOUNTER — Encounter (HOSPITAL_BASED_OUTPATIENT_CLINIC_OR_DEPARTMENT_OTHER): Payer: Self-pay | Admitting: Emergency Medicine

## 2024-02-03 ENCOUNTER — Emergency Department (HOSPITAL_BASED_OUTPATIENT_CLINIC_OR_DEPARTMENT_OTHER)

## 2024-02-03 ENCOUNTER — Other Ambulatory Visit (HOSPITAL_BASED_OUTPATIENT_CLINIC_OR_DEPARTMENT_OTHER): Payer: Self-pay

## 2024-02-03 DIAGNOSIS — I10 Essential (primary) hypertension: Secondary | ICD-10-CM | POA: Insufficient documentation

## 2024-02-03 DIAGNOSIS — R000 Tachycardia, unspecified: Secondary | ICD-10-CM | POA: Insufficient documentation

## 2024-02-03 DIAGNOSIS — K5792 Diverticulitis of intestine, part unspecified, without perforation or abscess without bleeding: Secondary | ICD-10-CM

## 2024-02-03 DIAGNOSIS — K5732 Diverticulitis of large intestine without perforation or abscess without bleeding: Secondary | ICD-10-CM | POA: Insufficient documentation

## 2024-02-03 DIAGNOSIS — R109 Unspecified abdominal pain: Secondary | ICD-10-CM | POA: Diagnosis present

## 2024-02-03 DIAGNOSIS — Z79899 Other long term (current) drug therapy: Secondary | ICD-10-CM | POA: Insufficient documentation

## 2024-02-03 LAB — LIPASE, BLOOD: Lipase: 49 U/L (ref 11–51)

## 2024-02-03 LAB — CBC
HCT: 47.1 % (ref 39.0–52.0)
Hemoglobin: 16.5 g/dL (ref 13.0–17.0)
MCH: 32.9 pg (ref 26.0–34.0)
MCHC: 35 g/dL (ref 30.0–36.0)
MCV: 93.8 fL (ref 80.0–100.0)
Platelets: 203 10*3/uL (ref 150–400)
RBC: 5.02 MIL/uL (ref 4.22–5.81)
RDW: 12 % (ref 11.5–15.5)
WBC: 10.5 10*3/uL (ref 4.0–10.5)
nRBC: 0 % (ref 0.0–0.2)

## 2024-02-03 LAB — COMPREHENSIVE METABOLIC PANEL
ALT: 90 U/L — ABNORMAL HIGH (ref 0–44)
AST: 60 U/L — ABNORMAL HIGH (ref 15–41)
Albumin: 4.4 g/dL (ref 3.5–5.0)
Alkaline Phosphatase: 67 U/L (ref 38–126)
Anion gap: 11 (ref 5–15)
BUN: 14 mg/dL (ref 6–20)
CO2: 26 mmol/L (ref 22–32)
Calcium: 9.5 mg/dL (ref 8.9–10.3)
Chloride: 99 mmol/L (ref 98–111)
Creatinine, Ser: 0.83 mg/dL (ref 0.61–1.24)
GFR, Estimated: >60 mL/min (ref >60)
Glucose, Bld: 112 mg/dL — ABNORMAL HIGH (ref 70–99)
Potassium: 4.6 mmol/L (ref 3.5–5.1)
Sodium: 136 mmol/L (ref 135–145)
Total Bilirubin: 0.9 mg/dL (ref 0.0–1.2)
Total Protein: 8.4 g/dL — ABNORMAL HIGH (ref 6.5–8.1)

## 2024-02-03 MED ORDER — CIPROFLOXACIN HCL 500 MG PO TABS: 500.0000 mg | ORAL_TABLET | Freq: Two times a day (BID) | ORAL | 0 refills | Status: DC

## 2024-02-03 MED ORDER — MORPHINE SULFATE (PF) 4 MG/ML IV SOLN: 4.0000 mg | Freq: Once | INTRAVENOUS | Status: DC

## 2024-02-03 MED ORDER — METRONIDAZOLE 500 MG PO TABS
500.0000 mg | ORAL_TABLET | Freq: Three times a day (TID) | ORAL | 0 refills | Status: DC
  Filled 2024-02-03: qty 14, 5d supply, fill #0

## 2024-02-03 MED ORDER — SODIUM CHLORIDE 0.9 % IV BOLUS
1000.0000 mL | Freq: Once | INTRAVENOUS | Status: AC
  Administered 2024-02-03: 1000 mL via INTRAVENOUS

## 2024-02-03 MED ORDER — FENTANYL CITRATE PF 50 MCG/ML IJ SOSY
50.0000 ug | PREFILLED_SYRINGE | INTRAMUSCULAR | Status: AC | PRN
  Administered 2024-02-03 (×2): 50 ug via INTRAVENOUS
  Filled 2024-02-03 (×2): qty 1

## 2024-02-03 MED ORDER — IOHEXOL 300 MG/ML  SOLN
75.0000 mL | Freq: Once | INTRAMUSCULAR | Status: AC | PRN
  Administered 2024-02-03: 75 mL via INTRAVENOUS

## 2024-02-03 MED ORDER — ONDANSETRON HCL 4 MG/2ML IJ SOLN
4.0000 mg | Freq: Once | INTRAMUSCULAR | Status: AC | PRN
  Administered 2024-02-03: 4 mg via INTRAVENOUS
  Filled 2024-02-03: qty 2

## 2024-02-03 MED ORDER — METRONIDAZOLE 500 MG PO TABS: 500.0000 mg | ORAL_TABLET | Freq: Two times a day (BID) | ORAL | 0 refills | Status: DC

## 2024-02-03 MED ORDER — HYDROCODONE-ACETAMINOPHEN 5-325 MG PO TABS
1.0000 | ORAL_TABLET | Freq: Four times a day (QID) | ORAL | 0 refills | Status: DC | PRN
Start: 2024-02-03 — End: 2024-04-20
  Filled 2024-02-03: qty 10, 3d supply, fill #0

## 2024-02-03 MED ORDER — CIPROFLOXACIN HCL 500 MG PO TABS
500.0000 mg | ORAL_TABLET | Freq: Two times a day (BID) | ORAL | 0 refills | Status: DC
  Filled 2024-02-03 (×2): qty 10, 5d supply, fill #0

## 2024-02-03 MED ORDER — HYDROMORPHONE HCL 1 MG/ML IJ SOLN
1.0000 mg | Freq: Once | INTRAMUSCULAR | Status: AC
  Administered 2024-02-03: 1 mg via INTRAVENOUS
  Filled 2024-02-03: qty 1

## 2024-02-03 MED ORDER — ONDANSETRON HCL 4 MG/2ML IJ SOLN: 4.0000 mg | Freq: Once | INTRAMUSCULAR | Status: DC

## 2024-02-03 NOTE — ED Notes (Signed)
 Pt alert and oriented X 4 at the time of discharge. RR even and unlabored. No acute distress noted. Pt verbalized understanding of discharge instructions as discussed. Pt ambulatory to lobby at time of discharge.

## 2024-02-03 NOTE — ED Notes (Signed)
 ED Provider at bedside.

## 2024-02-03 NOTE — Discharge Instructions (Signed)
 For the next 2 to 3 days I recommend clear liquid diet, you can then progress to liquid diet. If you are tolerating well slowly progressed into bland foods (toast, crackers, bananas, rice) and then you can gradually progress into your normal meal.  Sent an antibiotic to your pharmacy please take as prescribed.  I recommend Tylenol for pain control.  Make sure you are staying well-hydrated with water you can alternate Pedialyte and Gatorade drink.  He can take Norco for breakthrough pain.  Please follow-up with GI doctor and return to emergency room if you have any new or worsening symptoms.

## 2024-02-03 NOTE — Telephone Encounter (Signed)
 Thank you for the note.  I sent prescriptions for ciprofloxacin and metronidazole to his pharmacy so he can bring them on the trip.  H Danis

## 2024-02-03 NOTE — ED Provider Notes (Signed)
 [place] [facility] AT [facility] Provider Note   CSN: [id number] Arrival date & time: 02/03/24  4098     History  Chief Complaint  Patient presents with   Abdominal Pain    Christian Peterson is a 52 y.o. male with past medical history of diverticulitis with colon perforation, GERD, uncontrolled hypertension, heavy alcohol consumption, tobacco abuse, aortic arthrosclerosis presenting to the emergency room with complaint of abdominal pain that started approximately 12 hours ago.  Patient reports abdominal pain woke him up in the evening and is gradually gotten worse.  It is associated with nausea.  He reports it feels like diverticulitis in the past.  He denies any change in bowel movements or blood in the stool.  He does not have any vomiting.  Denies any chest pain, shortness of breath cough fever or chills.  Denies any prior abdominal surgery.  Denies any recent or current antibiotic use.  Denies any recent travel.   Abdominal Pain      Home Medications Prior to Admission medications   Medication Sig Start Date End Date Taking? Authorizing Provider  buPROPion (WELLBUTRIN XL) 150 MG 24 hr tablet Take 1 tablet (150 mg total) by mouth daily. 12/31/23   Saguier, Ramon Dredge, PA-C  ciprofloxacin (CIPRO) 500 MG tablet Take 1 tablet (500 mg total) by mouth 2 (two) times daily for 7 days. 02/03/24 02/10/24  Charlie Pitter III, MD  hydrALAZINE (APRESOLINE) 25 MG tablet TAKE 1 TABLET BY MOUTH THREE TIMES A DAY 12/22/23   Saguier, Ramon Dredge, PA-C  HYDROcodone-acetaminophen (NORCO/VICODIN) 5-325 MG tablet Take 1 tablet by mouth every 6 (six) hours as needed for severe pain. Patient not taking: Reported on 12/20/2023 02/05/23   Long, Arlyss Repress, MD  losartan (COZAAR) 100 MG tablet Take 1 tablet (100 mg total) by mouth daily. 12/31/23   Saguier, Ramon Dredge, PA-C  metoprolol succinate (TOPROL-XL) 50 MG 24 hr tablet TAKE 2 TABLETS BY MOUTH EVERY DAY 01/10/24   Saguier, Ramon Dredge, PA-C  metroNIDAZOLE  (FLAGYL) 500 MG tablet Take 1 tablet (500 mg total) by mouth 2 (two) times daily for 7 days. 02/03/24 02/10/24  Sherrilyn Rist, MD  morphine (MSIR) 15 MG tablet Take 0.5 tablets (7.5 mg total) by mouth every 4 (four) hours as needed for severe pain. Patient not taking: Reported on 12/20/2023 08/09/23   Melene Plan, DO  naproxen (NAPROSYN) 500 MG tablet Take 500 mg by mouth 2 (two) times daily as needed. Patient not taking: Reported on 12/20/2023 08/05/23   [provider]  ondansetron (ZOFRAN-ODT) 4 MG disintegrating tablet 4mg  ODT q4 hours prn nausea/vomit Patient not taking: Reported on 12/20/2023 08/09/23   Melene Plan, DO  pantoprazole (PROTONIX) 40 MG tablet Take 1 tablet (40 mg total) by mouth daily. 12/15/23   Sherrilyn Rist, MD  senna-docusate (SENOKOT-S) 8.6-50 MG tablet Take 1 tablet by mouth at bedtime as needed for mild constipation or moderate constipation. Patient not taking: Reported on 12/20/2023 02/05/23   Long, Arlyss Repress, MD  tiZANidine (ZANAFLEX) 4 MG tablet Take 4 mg by mouth every 6 (six) hours as needed. Patient not taking: Reported on 12/20/2023 08/05/23   [provider]      Allergies    Patient has no known allergies.    Review of Systems   Review of Systems  Gastrointestinal:  Positive for abdominal pain.    Physical Exam Updated Vital Signs BP (!) 190/122 (BP Location: Left Arm)   Pulse (!) 123  Temp 98.1 F (36.7 C) (Oral)   Resp (!) 24   Ht 5\' 2"  (1.575 m)   Wt 72.6 kg   SpO2 98%   BMI 29.26 kg/m  Physical Exam Vitals and nursing note reviewed.  Constitutional:      General: He is not in acute distress.    Appearance: He is not toxic-appearing.  HENT:     Head: Normocephalic and atraumatic.  Eyes:     General: No scleral icterus.    Conjunctiva/sclera: Conjunctivae normal.  Cardiovascular:     Rate and Rhythm: Regular rhythm. Tachycardia present.     Pulses: Normal pulses.     Heart sounds: Normal heart sounds.  Pulmonary:      Effort: Pulmonary effort is normal. No respiratory distress.     Breath sounds: Normal breath sounds.  Abdominal:     General: Abdomen is flat. Bowel sounds are normal. There is no distension.     Palpations: Abdomen is soft. There is no mass.     Tenderness: There is abdominal tenderness.     Comments: Reproducible tenderness in right lower quadrant, suprapubic area and left lower quadrant.  Skin:    General: Skin is warm and dry.     Findings: No lesion.  Neurological:     General: No focal deficit present.     Mental Status: He is alert and oriented to person, place, and time. Mental status is at baseline.  Psychiatric:     Comments: No tremor or sign of withdrawal on exam.      ED Results / Procedures / Treatments   Labs (all labs ordered are listed, but only abnormal results are displayed) Labs Reviewed  COMPREHENSIVE METABOLIC PANEL - Abnormal; Notable for the following components:      Result Value   Glucose, Bld 112 (*)    Total Protein 8.4 (*)    AST 60 (*)    ALT 90 (*)    All other components within normal limits  LIPASE, BLOOD  CBC  URINALYSIS, ROUTINE W REFLEX MICROSCOPIC    EKG None  Radiology CT ABDOMEN PELVIS W CONTRAST Result Date: 02/03/2024 CLINICAL DATA:  Acute generalized abdominal pain. EXAM: CT ABDOMEN AND PELVIS WITH CONTRAST TECHNIQUE: Multidetector CT imaging of the abdomen and pelvis was performed using the standard protocol following bolus administration of intravenous contrast. RADIATION DOSE REDUCTION: This exam was performed according to the departmental dose-optimization program which includes automated exposure control, adjustment of the mA and/or kV according to patient size and/or use of iterative reconstruction technique. CONTRAST:  75mL OMNIPAQUE IOHEXOL 300 MG/ML  SOLN COMPARISON:  August 09, 2023. FINDINGS: Lower chest: No acute abnormality. Hepatobiliary: No focal liver abnormality is seen. No gallstones, gallbladder wall thickening,  or biliary dilatation. Pancreas: Unremarkable. No pancreatic ductal dilatation or surrounding inflammatory changes. Spleen: Normal in size without focal abnormality. Adrenals/Urinary Tract: Adrenal glands appear normal. Small nonobstructive left renal calculus. No hydronephrosis or renal obstruction is noted. Urinary bladder is unremarkable. Stomach/Bowel: Stomach and appendix are unremarkable. There is no evidence of bowel obstruction. Proximal sigmoid diverticulitis is noted without definite abscess formation. Vascular/Lymphatic: Aortic atherosclerosis. No enlarged abdominal or pelvic lymph nodes. Reproductive: Prostate is unremarkable. Other: No ascites or hernia is noted. Musculoskeletal: No acute or significant osseous findings. IMPRESSION: Proximal sigmoid diverticulitis is noted without abscess formation. Small nonobstructive left renal calculus. Aortic Atherosclerosis (ICD10-I70.0). Electronically Signed   By: Lupita Raider M.D.   On: 02/03/2024 13:18    Procedures Procedures  Medications Ordered in ED Medications  ondansetron (ZOFRAN) injection 4 mg (4 mg Intravenous Given 02/03/24 1026)  fentaNYL (SUBLIMAZE) injection 50 mcg (50 mcg Intravenous Given 02/03/24 1148)  iohexol (OMNIPAQUE) 300 MG/ML solution 75 mL (75 mLs Intravenous Contrast Given 02/03/24 1211)  HYDROmorphone (DILAUDID) injection 1 mg (1 mg Intravenous Given 02/03/24 1238)  sodium chloride 0.9 % bolus 1,000 mL (1,000 mLs Intravenous New Bag/Given 02/03/24 1239)    ED Course/ Medical Decision Making/ A&P                                 Medical Decision Making Amount and/or Complexity of Data Reviewed Labs: ordered. Radiology: ordered.  Risk Prescription drug management.   JAVONTAY VANDAM 53 y.o. presented today for abd pain. Working DDx includes, but not limited to, gastroenteritis, colitis, SBO, appendicitis, cholecystitis, hepatobiliary pathology, gastritis, PUD, ACS, dissection, pancreatitis, nephrolithiasis,  AAA, UTI, pyelonephritis, Torsion.   R/o DDx: These are considered less likely than current impression due to history of present illness, physical exam, labs/imaging findings.  Review of prior external notes: 03/05/22 visit   Pmhx: diverticulitis with colon perforation, GERD, uncontrolled hypertension, heavy alcohol consumption, tobacco abuse, aortic arthrosclerosis   Unique Tests and My Interpretation:  CBC without leukocytosis no anemia CMP without electrolyte abnormality.  Has mild elevation in AST, ALT which I suspect is secondary to alcohol use.  He has prior history of elevated LFTs.  He has normal total bili.  Lipase 49.  UA pending  Imaging:  CT Abd/Pelvis with contrast: evaluate for structural/surgical etiology of patients' severe abdominal pain.    Problem List / ED Course / Critical interventions / Medication management  Reporting to emergency room with lower abdominal pain present for 12 hours.  Patient reports the pain is intermittent when it comes it feels like a painful ache or cramping sensation associated with nausea.  He appears in pain on exam.  He has reproducible tenderness and right and left lower quadrant but abdomen without any rashes or sign of distention.  His lungs are clear to auscultation bilaterally he is neurovascularly intact.  He is hypertensive and slightly tachycardic with normal sinus rhythm on monitor and I feel this is likely secondary to pain.  Will give pain medicine and Zofran and closely reassess.  Ordering abdominal labs and CT scan to evaluate for any acute pathology.  Labs are reassuring without leukocytosis.  He does not have fever. Not meeting SIRS.  CT scan shows proximal sigmoid diverticulitis without area of abscess or complication.  Upon reassessment he had improvement in symptoms.  He is hemodynamically stable. Passed po challenge. Does not have any signs of systemic illness.  Feel he is appropriate for outpatient trial of antibiotics and diet  modification.  Will send antibiotic, pain medicine.  He will follow-up with GI doctor.  Given return precautions.  He is stable for discharge I ordered medication including zofran, fentanyl Reevaluation of the patient after these medicines showed that the patient improved Patients vitals assessed. Upon arrival patient is hemodynamically stable.  I have reviewed the patients home medicines and have made adjustments as needed   Plan:  F/u w/ PCP in 2-3d to ensure resolution of sx.  Patient was given return precautions. Patient stable for discharge at this time.  Patient educated on current sx/dx and verbalized understanding of plan. Return to ER w/ new or worsening sx.  Final Clinical Impression(s) / ED Diagnoses Final diagnoses:  Diverticulitis    Rx / DC Orders ED Discharge Orders          Ordered    ciprofloxacin (CIPRO) 500 MG tablet  2 times daily        02/03/24 1339    metroNIDAZOLE (FLAGYL) 500 MG tablet  3 times daily        02/03/24 1339    HYDROcodone-acetaminophen (NORCO/VICODIN) 5-325 MG tablet  Every 6 hours PRN        02/03/24 1339              Halston Kintz, Horald Chestnut, PA-C 02/03/24 1339    Arby Barrette, MD 02/03/24 1556

## 2024-02-03 NOTE — ED Triage Notes (Signed)
 Pt having abdominal pain since last night.  Pt believes he is having a flare up of his diverticulosis.  No known fever.  Some nausea, no vomiting.

## 2024-02-03 NOTE — Telephone Encounter (Signed)
 Inbound call from patient stating that [facility] [place] and had a CT scan and it showed diverticulitis. Patient stated they gave him antibiotics  and pain medications. Patient requested to make an appointment with Dr. Myrtie Neither and it was made for 6/5 at 10:40. Patient is requesting a call back to discuss and to see if he can be seen sooner with Dr. Myrtie Neither.  Please advise.

## 2024-02-03 NOTE — Telephone Encounter (Signed)
 Spoke with pt and he is aware. States he is on the way to [employer], thinks he is having a flare now.

## 2024-02-04 NOTE — Telephone Encounter (Signed)
 Called the patient to discuss. Offering an appointment with an APP if he is having problems. No sooner appointment available with Dr Myrtie Neither at this time. No answer. Left message asking the patient to call back to discuss.

## 2024-02-08 NOTE — Telephone Encounter (Signed)
 Left message for pt to call back

## 2024-02-08 NOTE — Telephone Encounter (Signed)
 Spoke with the patient. He is under treatment for diverticulitis. Medications were prescribed to him by a provider at urgent care. He also picked up the prescription sent by Dr Myrtie Neither. He will take those with him on his cruise. He will return to [facility] 02/27/24.  Hopes for a sooner appointment. He would like to discuss options going forward. He feels he has diverticulitis too often despite his self care measures.

## 2024-02-16 IMAGING — CT CT ABD-PELV W/ CM
2 of 5 series · 15 of 46 positions shown, 17 images · IV contrast (agent unspecified)
Comparison: 10/20/2013

CLINICAL DATA: Left lower quadrant abdominal pain

EXAM:
CT ABDOMEN AND PELVIS WITH CONTRAST
TECHNIQUE: Multidetector CT imaging of the abdomen and pelvis was performed
using the standard protocol following bolus administration of
intravenous contrast.

[Series 2: axial st · axial · 0.81mm/px · z∈[-465,-90]mm · 12 of 85 slices shown, 14 images]
[im 5/85  soft-tissue]
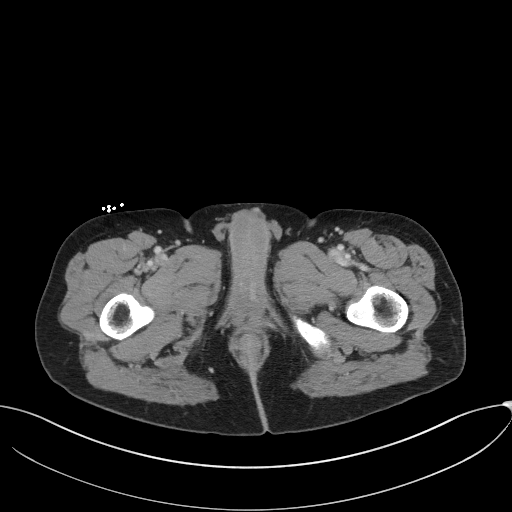
[im 5/85  bone]
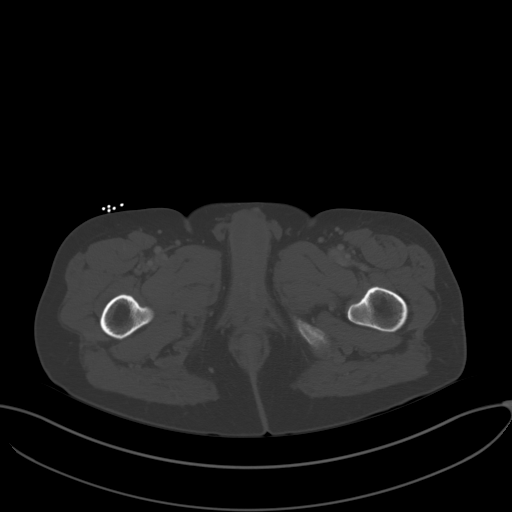
[im 15/85  soft-tissue]
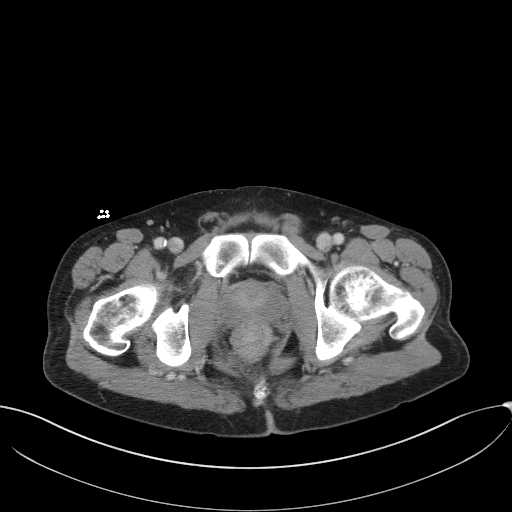
[im 19/85  soft-tissue]
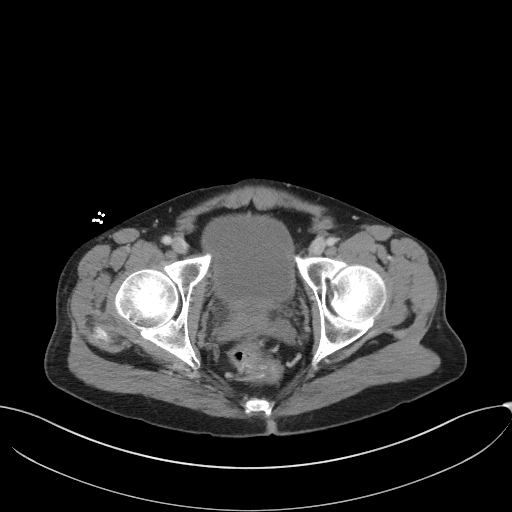
[im 24/85  soft-tissue]
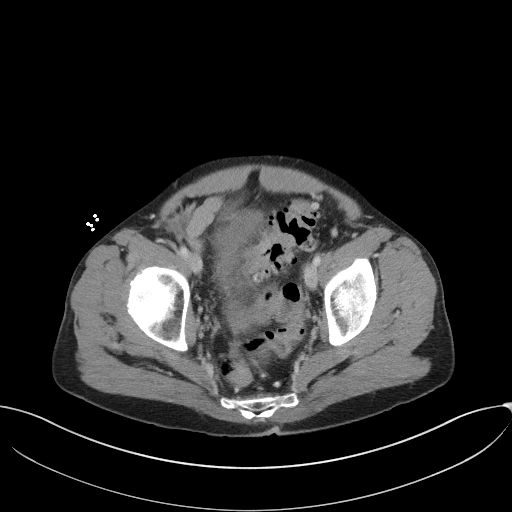
[im 33/85  soft-tissue]
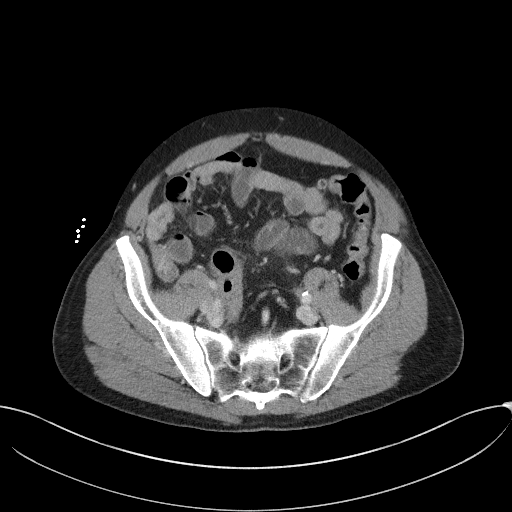
[im 38/85  soft-tissue]
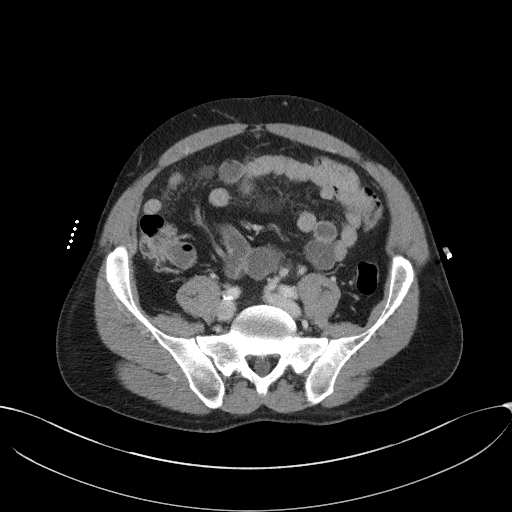
[im 47/85  soft-tissue]
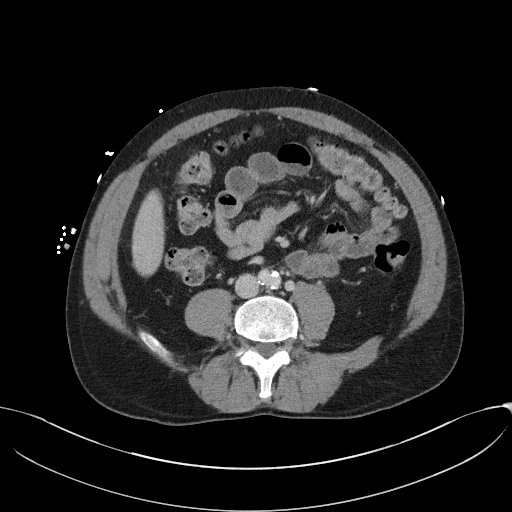
[im 52/85  soft-tissue]
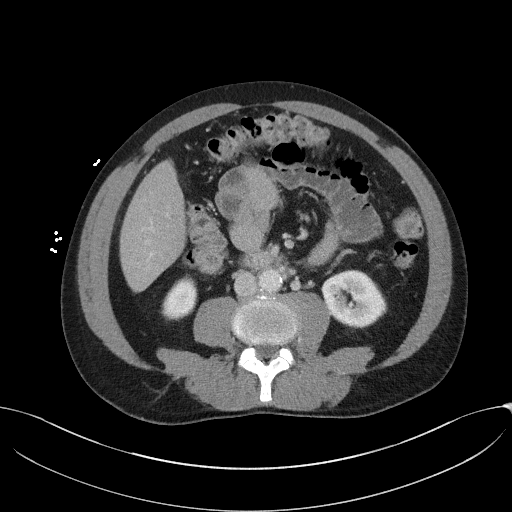
[im 61/85  soft-tissue]
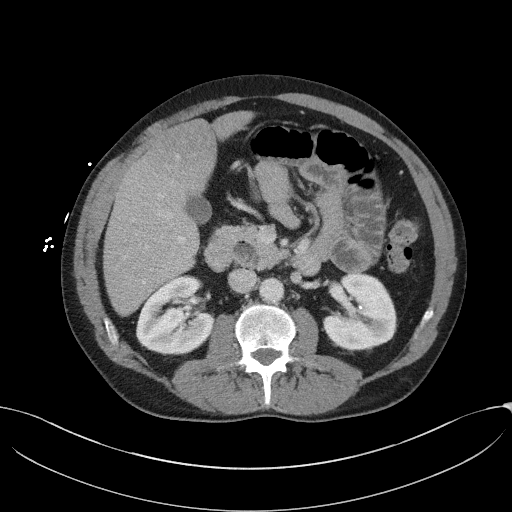
[im 61/85  bone]
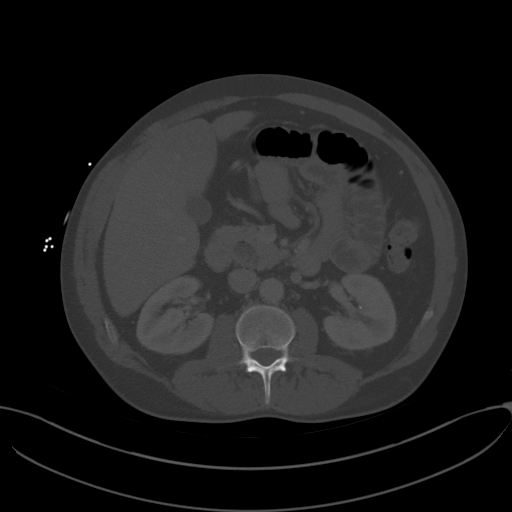
[im 66/85  soft-tissue]
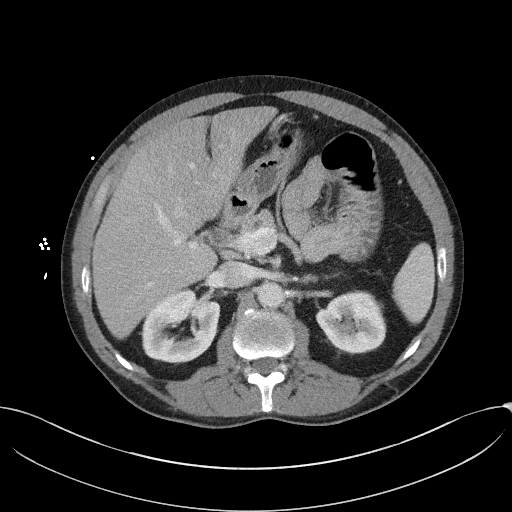
[im 71/85  soft-tissue]
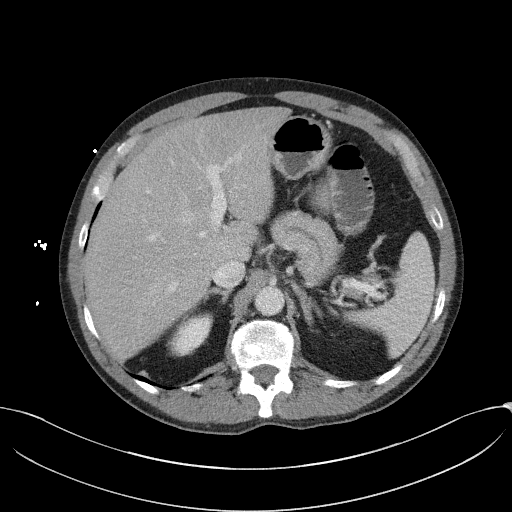
[im 80/85  soft-tissue]
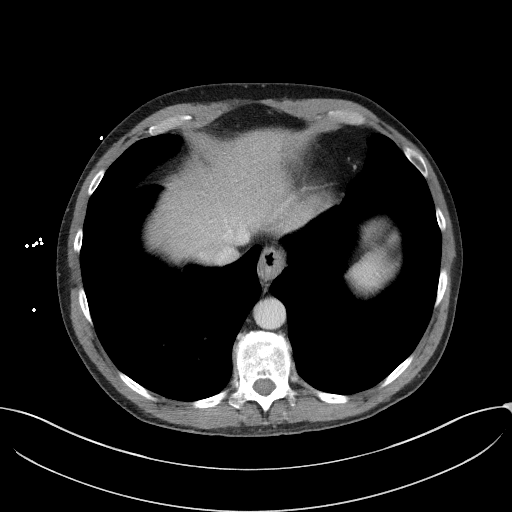

[Series 5: coronal st · coronal · 0.72mm/px · 3 of 111 slices shown]
[im 37/111  soft-tissue]
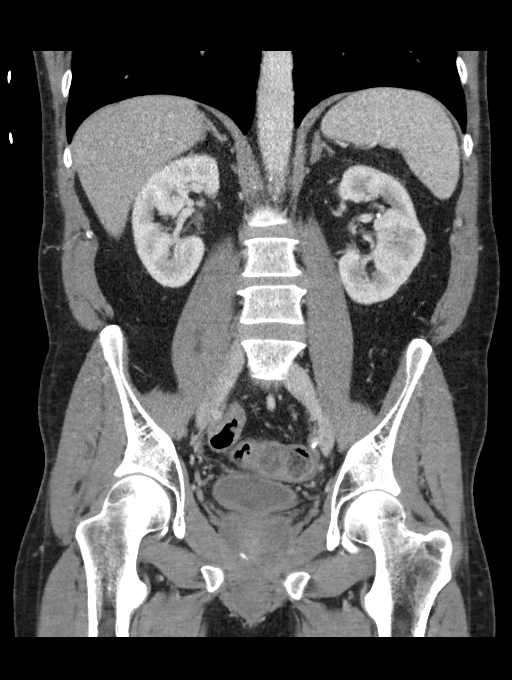
[im 49/111  soft-tissue]
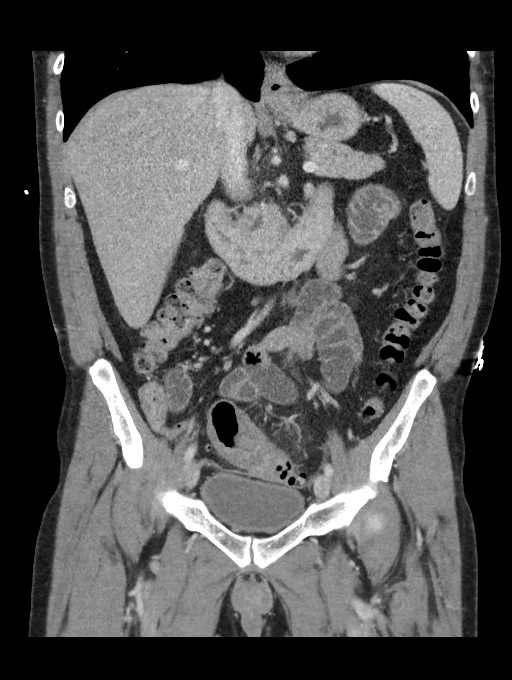
[im 62/111  soft-tissue]
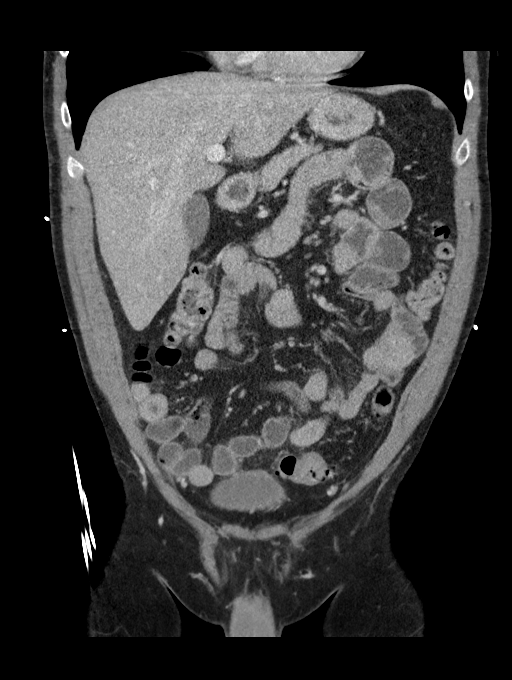

[15 of 46 positions shown; findings below may reference images not displayed]

RADIATION DOSE REDUCTION: This exam was performed according to the
departmental dose-optimization program which includes automated
exposure control, adjustment of the mA and/or kV according to
patient size and/or use of iterative reconstruction technique.

CONTRAST:  100mL OMNIPAQUE IOHEXOL 300 MG/ML  SOLN
FINDINGS: Lower chest: Included lung bases are clear.  Heart size is normal.

Hepatobiliary: No focal liver abnormality is seen. No gallstones,
gallbladder wall thickening, or biliary dilatation.

Pancreas: Stable mild dilation of the main pancreatic duct. No focal
parenchymal abnormality. No peripancreatic inflammatory changes.

Spleen: Normal in size without focal abnormality.

Adrenals/Urinary Tract: Unremarkable adrenal glands. Punctate 2 mm
nonobstructing stone at the upper pole of the left kidney. Kidneys
enhance symmetrically. No solid lesion or hydronephrosis. Urinary
bladder is within normal limits.

Stomach/Bowel: Acute diverticulitis of the mid sigmoid colon
containing multiple prominent diverticula. Relatively short segment
of bowel wall thickening with surrounding pericolonic fat stranding
and trace fluid. There are a few small foci of extraluminal air
adjacent to the inflamed segment compatible with micro perforation
(series 2, image 57). Multiple fluid-filled, mildly dilated loops of
small bowel throughout the abdomen without abrupt transition point,
likely a reactive enteritis or ileus. Normal appendix in the right
lower quadrant. Small hiatal hernia. Stomach otherwise unremarkable.

Vascular/Lymphatic: Scattered aortoiliac atherosclerotic
calcifications without aneurysm. No abdominopelvic lymphadenopathy.

Reproductive: Prostate is unremarkable.

Other: No significant ascites or organized fluid collection. No
abdominal wall abnormality.

Musculoskeletal: No acute or significant osseous findings.
IMPRESSION: 1. Acute sigmoid diverticulitis with microperforation.
2. Multiple fluid-filled, mildly dilated loops of small bowel
throughout the abdomen without abrupt transition point, likely a
reactive enteritis or ileus.
3. Nonobstructing left nephrolithiasis.

Aortic Atherosclerosis ([id number]).

These results were called by telephone at the time of interpretation
on 03/05/2022 at [DATE] to provider [place], who verbally
acknowledged these results.

## 2024-03-25 ENCOUNTER — Other Ambulatory Visit: Payer: Self-pay | Admitting: Gastroenterology

## 2024-04-20 ENCOUNTER — Encounter: Payer: Self-pay | Admitting: Gastroenterology

## 2024-04-20 ENCOUNTER — Ambulatory Visit (INDEPENDENT_AMBULATORY_CARE_PROVIDER_SITE_OTHER): Admitting: Gastroenterology

## 2024-04-20 VITALS — BP 146/78 | HR 87 | Ht 68.0 in | Wt 171.0 lb

## 2024-04-20 DIAGNOSIS — K5732 Diverticulitis of large intestine without perforation or abscess without bleeding: Secondary | ICD-10-CM | POA: Diagnosis not present

## 2024-04-20 DIAGNOSIS — R7989 Other specified abnormal findings of blood chemistry: Secondary | ICD-10-CM

## 2024-04-20 DIAGNOSIS — K219 Gastro-esophageal reflux disease without esophagitis: Secondary | ICD-10-CM | POA: Diagnosis not present

## 2024-04-20 NOTE — Patient Instructions (Signed)
 We placed a referral to [facility] for your Diverticulitis. Their office will call you to schedule an appointment.   _______________________________________________________  If your blood pressure at your visit was 140/90 or greater, please contact your primary care physician to follow up on this.  _______________________________________________________  If you are age 55 or older, your body mass index should be between 23-30. Your Body mass index is 26 kg/m. If this is out of the aforementioned range listed, please consider follow up with your Primary Care Provider.  If you are age 50 or younger, your body mass index should be between 19-25. Your Body mass index is 26 kg/m. If this is out of the aformentioned range listed, please consider follow up with your Primary Care Provider.   ________________________________________________________  The [facility] providers would like to encourage you to use MYCHART to communicate with providers for non-urgent requests or questions.  Due to long hold times on the telephone, sending your provider a message by [facility] may be a faster and more efficient way to get a response.  Please allow 48 business hours for a response.  Please remember that this is for non-urgent requests.  _______________________________________________________

## 2024-04-20 NOTE — Progress Notes (Signed)
 [facility] Progress Note  Chief Complaint: Diverticulitis and GERD with esophagitis  Subjective  Prior history   Diverticulitis Rx with Abx April 2023 (with microperforation and hospitalization), March 2024, September 2024, March 2025   EGD 09/14/2023: - Normal larynx.  - LA Grade C reflux esophagitis with no bleeding - 2-3 cm hiatal hernia.  - Gastritis. Biopsied.  - Erythematous duodenopathy. - Path report negative for H. Pylori  -Treated with PPI bid x 8 weeks then every day   Colonoscopy 09/14/2023: - Diverticulosis in the left colon. - Two 3 to 5 mm polyps in the descending colon and in the ascending colon, removed with a cold snare. Resected and retrieved.  - One 4 mm polyp in the distal rectum, removed with a cold snare. Resected and retrieved.  - Internal hemorrhoids.  - The examination was otherwise normal on direct and retroflexion views.  - Path report showed tubular adenomatous polyps  - Recall colonoscopy 3 years    Colonoscopy 08/13/2020: - Three 4 to 10 mm polyps in the distal transverse colon and in the ascending colon, removed with a cold snare. Resected and retrieved.  - One 8 mm polyp in the mid sigmoid colon, removed with a hot snare. Resected and retrieved.  - Diverticulosis in the left colon.  - Internal hemorrhoids.  - The examination was otherwise normal on direct and retroflexion views.  1. Surgical [P], colon, transverse-2 and ascending, polyp (3) - TUBULAR ADENOMA, NEGATIVE FOR HIGH GRADE DYSPLASIA (X MULTIPLE). 2. Surgical [P], colon, sigmoid, polyp - HYPERPLASTIC POLYP.   Discussed the use of AI scribe software for clinical note transcription with the patient, who gave verbal consent to proceed.  History of Present Illness Christian Peterson is a 53 year old male with recurrent diverticulitis who presents for evaluation of frequent episodes.  He has experienced recurrent episodes of diverticulitis, with three episodes occurring within  the past year. A particularly severe episode in 2023 required hospitalization for four days due to a microperforation of the colon. He manages these episodes with antibiotics, which have generally been effective in preventing hospitalization, except for the one severe episode. He is concerned about the unpredictability of these flare-ups, especially with upcoming travel plans, and is seeking options to manage or prevent future episodes.  He has a history of smoking and is working towards reducing his smoking habits. He consumes about a case of beer per week and has had persistent elevation in liver chemistry tests.  He has been taking pantoprazole  for significant stomach reflux issues, which were identified during an upper endoscopy. The medication has been effective in controlling symptoms of indigestion and heartburn, which previously occurred at least once or twice a week. His bowel habits are currently stable and he is feeling well today. ED visit 02/03/2024 for abdominal pain, CTAP showing proximal sigmoid diverticulitis without abscess or complication.  Treated with metronidazole  and ciprofloxacin    ROS: Cardiovascular:  no chest pain Respiratory: no dyspnea  The patient's Past Medical, Family and Social History were reviewed and are on file in the EMR. 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   Diverticulitis of colon with perforation 03/05/2022   Diverticulitis of intestine with perforation 03/05/2022   Dyspnea on exertion 01/14/2023   Elevated BP without diagnosis of hypertension 03/05/2022   Essential hypertension 01/14/2023   GERD (  gastroesophageal reflux disease)    occ with spicy foods    Heavy alcohol consumption 03/05/2022   Internal prolapsed hemorrhoids 03/05/2022   Syncope and collapse 01/14/2023   Tobacco abuse 03/05/2022   Uncontrolled hypertension  03/05/2022    Past Surgical History:  Procedure Laterality Date   ANKLE FRACTURE SURGERY     plate    WISDOM TOOTH EXTRACTION       Objective:  Med list reviewed  Current Outpatient Medications:    buPROPion  (WELLBUTRIN  XL) 150 MG 24 hr tablet, Take 1 tablet (150 mg total) by mouth daily., Disp: 90 tablet, Rfl: 3   hydrALAZINE  (APRESOLINE ) 25 MG tablet, TAKE 1 TABLET BY MOUTH THREE TIMES A DAY, Disp: 270 tablet, Rfl: 1   losartan  (COZAAR ) 100 MG tablet, Take 1 tablet (100 mg total) by mouth daily., Disp: 90 tablet, Rfl: 3   metoprolol  succinate (TOPROL -XL) 50 MG 24 hr tablet, TAKE 2 TABLETS BY MOUTH EVERY DAY, Disp: 180 tablet, Rfl: 0   pantoprazole  (PROTONIX ) 40 MG tablet, TAKE 1 TABLET BY MOUTH EVERY DAY, Disp: 90 tablet, Rfl: 0   Vital signs in last 24 hrs: There were no vitals filed for this visit. Wt Readings from Last 3 Encounters:  04/20/24 171 lb (77.6 kg)  02/03/24 160 lb (72.6 kg)  12/31/23 173 lb 12.8 oz (78.8 kg)    Physical Exam  Well-appearing, ruddy complexion HEENT: sclera anicteric, oral mucosa moist without lesions Neck: supple, no thyromegaly, JVD or lymphadenopathy Cardiac: Regular without appreciable murmur,  no peripheral edema Pulm: clear to auscultation bilaterally, normal RR and effort noted Abdomen: soft, no tenderness, with active bowel sounds. No guarding or palpable hepatosplenomegaly. Skin; warm and dry, no jaundice or rash   Labs:     Latest Ref Rng & Units 02/03/2024   10:19 AM 08/09/2023    9:55 AM 04/06/2023   11:35 AM  CBC  WBC 4.0 - 10.5 K/uL 10.5  8.0  8.3   Hemoglobin 13.0 - 17.0 g/dL 91.4  78.2  95.6   Hematocrit 39.0 - 52.0 % 47.1  45.7  50.5   Platelets 150 - 400 K/uL 203  228  204.0       Latest Ref Rng & Units 02/03/2024   10:19 AM 12/31/2023   11:33 AM 08/09/2023    9:55 AM  CMP  Glucose 70 - 99 mg/dL 213  086  578   BUN 6 - 20 mg/dL 14  13  13    Creatinine 0.61 - 1.24 mg/dL 4.69  6.29  5.28   Sodium 135 - 145 mmol/L  136  137  135   Potassium 3.5 - 5.1 mmol/L 4.6  5.2 No hemolysis seen  4.3   Chloride 98 - 111 mmol/L 99  99  99   CO2 22 - 32 mmol/L 26  29  25    Calcium 8.9 - 10.3 mg/dL 9.5  9.6  9.2   Total Protein 6.5 - 8.1 g/dL 8.4  7.8  7.4   Total Bilirubin 0.0 - 1.2 mg/dL 0.9  0.6  0.7   Alkaline Phos 38 - 126 U/L 67  72  60   AST 15 - 41 U/L 60  48  35   ALT 0 - 44 U/L 90  55  53     ___________________________________________ Radiologic studies: CLINICAL DATA:  Acute generalized abdominal pain.   EXAM: CT ABDOMEN AND PELVIS WITH CONTRAST   TECHNIQUE: Multidetector CT imaging of the abdomen and pelvis was performed using  the standard protocol following bolus administration of intravenous contrast.   RADIATION DOSE REDUCTION: This exam was performed according to the departmental dose-optimization program which includes automated exposure control, adjustment of the mA and/or kV according to patient size and/or use of iterative reconstruction technique.   CONTRAST:  75mL OMNIPAQUE  IOHEXOL  300 MG/ML  SOLN   COMPARISON:  August 09, 2023.   FINDINGS: Lower chest: No acute abnormality.   Hepatobiliary: No focal liver abnormality is seen. No gallstones, gallbladder wall thickening, or biliary dilatation.   Pancreas: Unremarkable. No pancreatic ductal dilatation or surrounding inflammatory changes.   Spleen: Normal in size without focal abnormality.   Adrenals/Urinary Tract: Adrenal glands appear normal. Small nonobstructive left renal calculus. No hydronephrosis or renal obstruction is noted. Urinary bladder is unremarkable.   Stomach/Bowel: Stomach and appendix are unremarkable. There is no evidence of bowel obstruction. Proximal sigmoid diverticulitis is noted without definite abscess formation.   Vascular/Lymphatic: Aortic atherosclerosis. No enlarged abdominal or pelvic lymph nodes.   Reproductive: Prostate is unremarkable.   Other: No ascites or hernia is noted.    Musculoskeletal: No acute or significant osseous findings.   IMPRESSION: Proximal sigmoid diverticulitis is noted without abscess formation.   Small nonobstructive left renal calculus.   Aortic Atherosclerosis (ICD10-I70.0).     Electronically Signed   By: Rosalene Colon M.D.   On: 02/03/2024 13:18   (CT images personally reviewed-H Danis MD) ____________________________________________ Other:   _____________________________________________   No diagnosis found.  Assessment and Plan Assessment & Plan Diverticulitis Recurrent diverticulitis with three episodes in the past year, additionally one severe previous episode requiring hospitalization. Concern for complications such as free perforation developing with future episodes. Surgical consultation advised. - Refer to a colon and rectal surgeon for consultation regarding potential surgical intervention.  He was definitely interested in having a surgical consultation.  Gastroesophageal reflux disease (GERD) Significant reflux with esophageal irritation managed with pantoprazole . Lifestyle factors like smoking and alcohol may contribute to symptoms. - Continue pantoprazole . - Advise smoking cessation and reducing alcohol consumption.  Elevated liver enzymes Persistent elevation likely related to alcohol consumption, increasing risk of liver damage and progression to cirrhosis - Advise reducing alcohol consumption. - Discuss importance of smoking cessation.  He has a trip planned in September of this year and will call back to request antibiotics to take with him on that cruise in case he should develop a diverticulitis episode while traveling. Further follow-up recommendations pending surgical consultation.  32 minutes were spent on this encounter (including chart review, history/exam, counseling/coordination of care, and documentation) > 50% of that time was spent on counseling and coordination of care.   Christian Peterson  Record review addendum 04/21/24  Patient saw Dr Joyce Nixon in office consult for diverticulitis on 04/21/24.  She recommended smoking cessation, fiber supplement and no no surgery at this point.  Memory Staggers, MD

## 2024-06-29 ENCOUNTER — Other Ambulatory Visit: Payer: Self-pay | Admitting: Medical

## 2024-07-06 ENCOUNTER — Ambulatory Visit: Payer: Self-pay

## 2024-07-06 NOTE — Telephone Encounter (Signed)
 FYI Only or Action Required?: FYI only for provider.  Patient was last seen in primary care on 12/31/2023 by Dorina Loving, PA-C.  Called Nurse Triage reporting Back Pain.  Symptoms began several years ago.  Interventions attempted: OTC medications: tylenol  and ibuprogen.  Symptoms are: gradually worsening.  Triage Disposition: See PCP Within 2 Weeks  Patient/caregiver understands and will follow disposition?: Yes    Copied from CRM #[id number]. Topic: Clinical - Red Word Triage >> Jul 06, 2024  9:11 AM Adelita E wrote: [facility] that prompted transfer to Nurse Triage: Severe back pain. Patient stated he has had back pain for years, but progressively getting worse within the past 3-4 months. Reason for Disposition  Back pain is a chronic symptom (recurrent or ongoing AND present > 4 weeks)  Answer Assessment - Initial Assessment Questions 1. ONSET: When did the pain begin? (e.g., minutes, hours, days)     Years and years but has gotten worse over the past 3-4 months 2. LOCATION: Where does it hurt? (upper, mid or lower back)     lower 3. SEVERITY: How bad is the pain?  (e.g., Scale 1-10; mild, moderate, or severe)     8/10 4. PATTERN: Is the pain constant? (e.g., yes, no; constant, intermittent)      constant 5. RADIATION: Does the pain shoot into your legs or somewhere else?     no 6. CAUSE:  What do you think is causing the back pain?          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 BACK OVERUSE:  Any recent lifting of heavy objects, strenuous work or exercise?         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 MEDICINES: What have you taken so far for the pain? (e.g., nothing, acetaminophen , NSAIDS)     Nsaids and tylenol , 9. NEUROLOGIC SYMPTOMS: Do you have any weakness, numbness, or problems with bowel/bladder control?     no 10. OTHER SYMPTOMS: Do you have any other symptoms?  (e.g., fever, abdomen pain, burning with urination, blood in urine)       no  Protocols used: Back Pain-A-AH

## 2024-07-07 ENCOUNTER — Ambulatory Visit (HOSPITAL_BASED_OUTPATIENT_CLINIC_OR_DEPARTMENT_OTHER)
Admission: RE | Admit: 2024-07-07 | Discharge: 2024-07-07 | Disposition: A | Discharge status: Home / Self Care | Source: Ambulatory Visit | Attending: Medical | Admitting: Medical

## 2024-07-07 ENCOUNTER — Ambulatory Visit: Payer: Self-pay | Admitting: Medical

## 2024-07-07 ENCOUNTER — Ambulatory Visit: Admitting: Medical

## 2024-07-07 VITALS — BP 150/90 | HR 99 | Temp 98.3°F | Resp 16 | Ht 68.0 in | Wt 179.2 lb

## 2024-07-07 DIAGNOSIS — G8929 Other chronic pain: Secondary | ICD-10-CM | POA: Insufficient documentation

## 2024-07-07 DIAGNOSIS — M545 Low back pain, unspecified: Secondary | ICD-10-CM

## 2024-07-07 MED ORDER — HYDROCODONE-ACETAMINOPHEN 5-325 MG PO TABS: ORAL_TABLET | ORAL | 0 refills | Status: AC

## 2024-07-07 MED ORDER — METHYLPREDNISOLONE 4 MG PO TABS: ORAL_TABLET | ORAL | 0 refills | Status: AC

## 2024-07-07 MED ORDER — CYCLOBENZAPRINE HCL 5 MG PO TABS: ORAL_TABLET | ORAL | 0 refills | Status: AC

## 2024-07-07 NOTE — Patient Instructions (Signed)
 Chronic low back pain with acute exacerbation Chronic low back pain exacerbated by recent events, severe and unresponsive to acetaminophen . No neurological red flags present. - Order lumbar spine x-ray. - Prescribe Medrol  6-day taper (4 mg). - Prescribe Flexeril  5 mg at night. - Prescribe Norco 5/325 mg, 12 tablets, every 8 hours as needed. - Follow up in 7 days   Elevated blood pressure Blood pressure elevated likely due to pain, improved but still above target. 150/90 on recheck - Aim for blood pressure less than 140/90 mmHg, ideally 130/80 mmHg. -expect bp to come down as pain level controlled. -continue current bp med regimen.  - if on follow up bp still high despite lesser pain then would increase hydralazine  to 50 mg three time daily  Follow up 7 days or sooner if needed
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
## 2024-07-14 ENCOUNTER — Other Ambulatory Visit: Payer: Self-pay

## 2024-07-14 ENCOUNTER — Emergency Department (HOSPITAL_BASED_OUTPATIENT_CLINIC_OR_DEPARTMENT_OTHER)

## 2024-07-14 ENCOUNTER — Ambulatory Visit: Admitting: Medical

## 2024-07-14 ENCOUNTER — Emergency Department (HOSPITAL_BASED_OUTPATIENT_CLINIC_OR_DEPARTMENT_OTHER)
Admission: EM | Admit: 2024-07-14 | Discharge: 2024-07-14 | Disposition: A | Discharge status: Home / Self Care | Attending: Emergency Medicine | Admitting: Emergency Medicine

## 2024-07-14 DIAGNOSIS — D72829 Elevated white blood cell count, unspecified: Secondary | ICD-10-CM | POA: Insufficient documentation

## 2024-07-14 DIAGNOSIS — K5900 Constipation, unspecified: Secondary | ICD-10-CM | POA: Insufficient documentation

## 2024-07-14 DIAGNOSIS — M5442 Lumbago with sciatica, left side: Secondary | ICD-10-CM

## 2024-07-14 DIAGNOSIS — K5792 Diverticulitis of intestine, part unspecified, without perforation or abscess without bleeding: Secondary | ICD-10-CM

## 2024-07-14 DIAGNOSIS — R1084 Generalized abdominal pain: Secondary | ICD-10-CM | POA: Diagnosis present

## 2024-07-14 DIAGNOSIS — E871 Hypo-osmolality and hyponatremia: Secondary | ICD-10-CM | POA: Diagnosis not present

## 2024-07-14 DIAGNOSIS — N132 Hydronephrosis with renal and ureteral calculous obstruction: Secondary | ICD-10-CM | POA: Diagnosis not present

## 2024-07-14 LAB — CBC WITH DIFFERENTIAL/PLATELET
Abs Immature Granulocytes: 0.15 K/uL — ABNORMAL HIGH (ref 0.00–0.07)
Basophils Absolute: 0.1 K/uL (ref 0.0–0.1)
Basophils Relative: 1 %
Eosinophils Absolute: 0.1 K/uL (ref 0.0–0.5)
Eosinophils Relative: 1 %
HCT: 43.6 % (ref 39.0–52.0)
Hemoglobin: 15.1 g/dL (ref 13.0–17.0)
Immature Granulocytes: 2 %
Lymphocytes Relative: 11 %
Lymphs Abs: 1.1 K/uL (ref 0.7–4.0)
MCH: 33 pg (ref 26.0–34.0)
MCHC: 34.6 g/dL (ref 30.0–36.0)
MCV: 95.4 fL (ref 80.0–100.0)
Monocytes Absolute: 0.9 K/uL (ref 0.1–1.0)
Monocytes Relative: 9 %
Neutro Abs: 7.2 K/uL (ref 1.7–7.7)
Neutrophils Relative %: 76 %
Platelets: 190 K/uL (ref 150–400)
RBC: 4.57 MIL/uL (ref 4.22–5.81)
RDW: 11.9 % (ref 11.5–15.5)
WBC: 9.5 K/uL (ref 4.0–10.5)
nRBC: 0 % (ref 0.0–0.2)

## 2024-07-14 LAB — URINALYSIS, W/ REFLEX TO CULTURE (INFECTION SUSPECTED)
Bilirubin Urine: NEGATIVE
Glucose, UA: NEGATIVE mg/dL
Hgb urine dipstick: NEGATIVE
Ketones, ur: NEGATIVE mg/dL
Leukocytes,Ua: NEGATIVE
Nitrite: NEGATIVE
Protein, ur: NEGATIVE mg/dL
Specific Gravity, Urine: 1.02 (ref 1.005–1.030)
pH: 5.5 (ref 5.0–8.0)

## 2024-07-14 LAB — BASIC METABOLIC PANEL WITH GFR
Anion gap: 13 (ref 5–15)
BUN: 19 mg/dL (ref 6–20)
CO2: 24 mmol/L (ref 22–32)
Calcium: 9.3 mg/dL (ref 8.9–10.3)
Chloride: 99 mmol/L (ref 98–111)
Creatinine, Ser: 0.79 mg/dL (ref 0.61–1.24)
GFR, Estimated: >60 mL/min (ref >60)
Glucose, Bld: 100 mg/dL — ABNORMAL HIGH (ref 70–99)
Potassium: 4.5 mmol/L (ref 3.5–5.1)
Sodium: 136 mmol/L (ref 135–145)

## 2024-07-14 MED ORDER — DIAZEPAM 5 MG/ML IJ SOLN
5.0000 mg | Freq: Once | INTRAMUSCULAR | Status: AC
  Administered 2024-07-14: 5 mg via INTRAMUSCULAR
  Filled 2024-07-14: qty 2

## 2024-07-14 MED ORDER — KETOROLAC TROMETHAMINE 15 MG/ML IJ SOLN
15.0000 mg | Freq: Once | INTRAMUSCULAR | Status: AC
  Administered 2024-07-14: 15 mg via INTRAMUSCULAR
  Filled 2024-07-14: qty 1

## 2024-07-14 MED ORDER — IBUPROFEN 600 MG PO TABS: 600.0000 mg | ORAL_TABLET | Freq: Four times a day (QID) | ORAL | 0 refills | Status: AC | PRN

## 2024-07-14 MED ORDER — LIDOCAINE 5 % EX PTCH
1.0000 | MEDICATED_PATCH | CUTANEOUS | Status: DC
  Administered 2024-07-14: 1 via TRANSDERMAL
  Filled 2024-07-14: qty 1

## 2024-07-14 MED ORDER — LIDOCAINE 5 % EX PTCH
1.0000 | MEDICATED_PATCH | CUTANEOUS | 0 refills | Status: AC
Start: 2024-07-14 — End: ?

## 2024-07-14 MED ORDER — OXYCODONE HCL 5 MG PO TABS
5.0000 mg | ORAL_TABLET | Freq: Once | ORAL | Status: AC
  Administered 2024-07-14: 5 mg via ORAL
  Filled 2024-07-14: qty 1

## 2024-07-14 MED ORDER — AMOXICILLIN-POT CLAVULANATE 875-125 MG PO TABS
1.0000 | ORAL_TABLET | Freq: Two times a day (BID) | ORAL | 0 refills | Status: AC
Start: 2024-07-14 — End: ?

## 2024-07-14 MED ORDER — KETOROLAC TROMETHAMINE 30 MG/ML IJ SOLN
15.0000 mg | Freq: Once | INTRAMUSCULAR | Status: AC
  Administered 2024-07-14: 15 mg via INTRAMUSCULAR
  Filled 2024-07-14: qty 1

## 2024-07-14 NOTE — ED Provider Notes (Signed)
 [facility] Provider Note  CSN: [id number] Arrival date & time: 07/14/24 9064  Chief Complaint(s) Back Pain  HPI Christian Peterson is a 53 y.o. male history of hypertension, chronic back pain presents to the emergency department back pain.  Patient reports back pain for months, has been worse over the past week.  Reports saw his primary doctor about a week ago and was given a dose of steroids and some Norco.  Reports that pain got worse last night.  Radiates into the left leg, has some tingling in the left foot.  No bowel or bladder incontinence.  No recent trauma.  No history of IV drug use.  No fevers or chills.  Able to walk.  No other numbness or tingling.  No groin numbness or tingling.  Is only taking Norco and Tylenol .   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   Diverticulitis of colon with perforation 03/05/2022   Diverticulitis of intestine with perforation 03/05/2022   Dyspnea on exertion 01/14/2023   Elevated BP without diagnosis of hypertension 03/05/2022   Essential hypertension 01/14/2023   GERD (gastroesophageal reflux disease)    occ with spicy foods    Heavy alcohol consumption 03/05/2022   Internal prolapsed hemorrhoids 03/05/2022   Syncope and collapse 01/14/2023   Tobacco abuse 03/05/2022   Uncontrolled hypertension 03/05/2022   Patient Active Problem List   Diagnosis Date Noted   Essential hypertension 01/14/2023   Aortic atherosclerosis (HCC) 01/14/2023   Dyspnea on exertion 01/14/2023   Syncope and collapse 01/14/2023   Cellulitis 01/07/2023   Diverticulitis of colon with perforation 03/05/2022   GERD (gastroesophageal reflux disease) 03/05/2022   Allergy 03/05/2022   Internal prolapsed hemorrhoids 03/05/2022   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 Authorizing Provider  amoxicillin -clavulanate (AUGMENTIN ) 875-125 MG tablet Take 1 tablet by mouth every 12 (twelve) hours. 07/14/24  Yes Francesca Elsie CROME, MD  HYDROcodone -acetaminophen  (NORCO/VICODIN) 5-325 MG tablet 1 tab po q 8 hrs prn severe pain Patient taking differently: Take 1 tablet by mouth every 8 (eight) hours as needed for severe pain (pain score 7-10). 07/07/24  Yes Saguier, [place], PA-C  ibuprofen  (ADVIL ) 600 MG tablet Take 1 tablet (600 mg total) by mouth every 6 (six) hours as needed. 07/14/24  Yes Francesca Elsie CROME, MD  lidocaine  (LIDODERM ) 5 % Place 1 patch onto the skin daily. Remove & Discard patch within 12 hours or as directed by MD 07/14/24  Yes Francesca Elsie CROME, MD  methylPREDNISolone  (MEDROL ) 4 MG tablet Standard 6 day taper dose pack 07/07/24  Yes Saguier, [place], PA-C  buPROPion  (WELLBUTRIN  XL) 150 MG 24 hr tablet Take 1 tablet (150 mg total) by mouth daily. 12/31/23   Saguier, [place], PA-C  cyclobenzaprine  (FLEXERIL ) 5 MG tablet 1 tab po q hs prn low back pain muscle spasms Patient taking differently: Take 5 mg by mouth at bedtime as needed for muscle spasms (low back pain). 07/07/24   Saguier, [place], PA-C  hydrALAZINE  (APRESOLINE ) 25 MG tablet TAKE 1 TABLET BY  MOUTH THREE TIMES A DAY 06/29/24   Saguier, [place], PA-C  losartan  (COZAAR ) 100 MG tablet Take 1 tablet (100 mg total) by mouth daily. 12/31/23   Saguier, [place], PA-C  metoprolol  succinate (TOPROL -XL) 50 MG 24 hr tablet TAKE 2 TABLETS BY MOUTH EVERY DAY 01/10/24   Saguier, [place], PA-C  pantoprazole  (PROTONIX ) 40 MG tablet TAKE 1 TABLET BY MOUTH EVERY DAY 03/27/24   Legrand Victory LITTIE DOUGLAS, MD                                                                                                                                     Past Surgical History Past Surgical History:  Procedure Laterality Date   ANKLE FRACTURE SURGERY     plate    WISDOM TOOTH EXTRACTION     Family History Family History  Adopted: Yes    Social History Social History   Tobacco Use   Smoking status: Every Day    Current packs/day: 1.00    Types: Cigarettes   Smokeless tobacco: Never  Vaping Use   Vaping status: Former  Substance Use Topics   Alcohol use: Yes    Comment: Drinks 5 out of 7 days.  Mostly beer.  Some liquor.   Drug use: Yes    Types: Marijuana    Comment: within the past week   Allergies Patient has no known allergies.  Review of Systems Review of Systems  All other systems reviewed and are negative.   Physical Exam Vital Signs  I have reviewed the triage vital signs BP (!) 172/104 (BP Location: Right Arm)   Pulse 82   Temp 98 F (36.7 C) (Oral)   Resp 18   Ht 5' 8 (1.727 m)   Wt 77.1 kg   SpO2 97%   BMI 25.85 kg/m  Physical Exam Vitals and nursing note reviewed.  Constitutional:      General: He is not in acute distress.    Appearance: Normal appearance.  HENT:     Mouth/Throat:     Mouth: Mucous membranes are moist.  Eyes:     Conjunctiva/sclera: Conjunctivae normal.  Cardiovascular:     Rate and Rhythm: Normal rate and regular rhythm.  Pulmonary:     Effort: Pulmonary effort is normal. No respiratory distress.     Breath sounds: Normal breath sounds.  Abdominal:     General: Abdomen is flat.     Palpations: Abdomen is soft.     Tenderness: There is no abdominal tenderness.  Musculoskeletal:     Comments: No focal C, T, L-spine tenderness, no step-off. Mild bilateral lumbar paraspinal tenderness  Skin:    General: Skin is warm and dry.     Capillary Refill: Capillary refill takes less than 2 seconds.  Neurological:     Mental Status: He is alert and oriented  to person, place, and time. Mental status is at baseline.     Comments:  Strength 5 out of 5 in the bilateral lower extremities, no sensory deficit to light touch  Psychiatric:        Mood and Affect: Mood normal.        Behavior: Behavior normal.     ED Results and Treatments Labs (all labs ordered are listed, but only abnormal results are displayed) Labs Reviewed  BASIC METABOLIC PANEL WITH GFR - Abnormal; Notable for the following components:      Result Value   Glucose, Bld 100 (*)    All other components within normal limits  CBC WITH DIFFERENTIAL/PLATELET - Abnormal; Notable for the following components:   Abs Immature Granulocytes 0.15 (*)    All other components within normal limits  URINALYSIS, W/ REFLEX TO CULTURE (INFECTION SUSPECTED) - Abnormal; Notable for the following components:   Bacteria, UA RARE (*)    All other components within normal limits                                                                                                                          Radiology CT Renal Stone Study Result Date: 07/14/2024 CLINICAL DATA:  Severe back and left leg pain. EXAM: CT ABDOMEN AND PELVIS WITHOUT CONTRAST TECHNIQUE: Multidetector CT imaging of the abdomen and pelvis was performed following the standard protocol without IV contrast. RADIATION DOSE REDUCTION: This exam was performed according to the departmental dose-optimization program which includes automated exposure control, adjustment of the mA and/or kV according to patient size and/or use of iterative reconstruction technique. COMPARISON:  February 03, 2024. FINDINGS: Lower chest: No acute abnormality. Hepatobiliary: No focal liver abnormality is seen. No gallstones, gallbladder wall thickening, or biliary dilatation. Pancreas: Unremarkable. No pancreatic ductal dilatation or surrounding inflammatory changes. Spleen: Normal in size without focal abnormality. Adrenals/Urinary Tract: Adrenal glands appear normal. Small  nonobstructive left renal calculus. No hydronephrosis or renal obstruction is noted. Urinary bladder is unremarkable. Stomach/Bowel: Stomach and appendix are unremarkable. There is no evidence of bowel obstruction. Mild focal proximal sigmoid diverticulitis is noted without abscess formation. Vascular/Lymphatic: Aortic atherosclerosis. No enlarged abdominal or pelvic lymph nodes. Reproductive: Prostate is unremarkable. Other: No abdominal wall hernia or abnormality. No abdominopelvic ascites. Musculoskeletal: No acute or significant osseous findings. IMPRESSION: 1. Mild focal proximal sigmoid diverticulitis is noted without abscess formation. 2. Small nonobstructive left renal calculus. 3. Aortic atherosclerosis. Aortic Atherosclerosis (ICD10-I70.0). Electronically Signed   By: Lynwood Landy Raddle M.D.   On: 07/14/2024 12:28    Pertinent labs & imaging results that were available during my care of the patient were reviewed by me and considered in my medical decision making (see MDM for details).  Medications Ordered in ED Medications  lidocaine  (LIDODERM ) 5 % 1 patch (1 patch Transdermal Patch Applied 07/14/24 1154)  ketorolac  (TORADOL ) 15 MG/ML injection 15 mg (15 mg Intramuscular Given 07/14/24 1101)  diazepam  (VALIUM ) injection 5 mg (5 mg Intramuscular Given 07/14/24 1101)  oxyCODONE  (Oxy IR/ROXICODONE ) immediate release tablet 5 mg (5 mg Oral  Given 07/14/24 1101)  ketorolac  (TORADOL ) 30 MG/ML injection 15 mg (15 mg Intramuscular Given 07/14/24 1155)                                                                                                                                     Procedures Procedures  (including critical care time)  Medical Decision Making / ED Course   MDM:  53 year old presenting to the emergency department with back pain.  Patient overall well-appearing.  uncomfortable due to back pain.  Neurologic exam is reassuring.  Suspect possible sciatica given radiation down into the  left leg.  Considered other process such as fracture but no recent trauma.  Considered spinal cord compression from infection or injury but neurologic exam is reassuring, patient denies fevers or IVDU.  Differential also includes musculoskeletal process.  Considered cauda equina syndrome but no weakness, bowel or bladder incontinence..  Patient had appointment for follow-up with his primary doctor today but they apparently had him come to the ER instead.  I do not think further imaging or medical workup is indicated at this time.  Will attempt to control symptoms and anticipate discharge  Clinical Course as of 07/14/24 1346  Fri Jul 14, 2024  1341 Patient was having persistent pain after receiving Toradol , Valium  and oxycodone .  Given his persistent pain, labs were obtained as well as CT scan.  CT scan shows small area of diverticulitis without abscess.  He is not having abdominal pain but does have significant history of this.  Unclear whether this is an acute process but given his history we will treat with Augmentin .  He reports that this pain does not feel like prior episodes of diverticulitis.  He does report feeling much better after receiving second dose of Toradol  and seems much more comfortable.  Feel patient is stable for discharge at this time given his improvement. Will discharge patient to home. All questions answered. Patient comfortable with plan of discharge. Return precautions discussed with patient and specified on the after visit summary.  [WS]    Clinical Course User Index [WS] Francesca Elsie CROME, MD     Additional history obtained: -Additional history obtained from ems -External records from outside source obtained and reviewed including: Chart review including previous notes, labs, imaging, consultation notes including prior pmd notes    Lab Tests: -I ordered, reviewed, and interpreted labs.   The pertinent results include:   Labs Reviewed  BASIC METABOLIC PANEL WITH GFR -  Abnormal; Notable for the following components:      Result Value   Glucose, Bld 100 (*)    All other components within normal limits  CBC WITH DIFFERENTIAL/PLATELET - Abnormal; Notable for the following components:   Abs Immature Granulocytes 0.15 (*)    All other components within normal limits  URINALYSIS, W/ REFLEX TO CULTURE (INFECTION SUSPECTED) - Abnormal; Notable for the following components:   Bacteria, UA RARE (*)  All other components within normal limits    Notable for no leukocytosis    Imaging Studies ordered: I ordered imaging studies including CT abdomen  On my interpretation imaging demonstrates diverticulitis   I independently visualized and interpreted imaging. I agree with the radiologist interpretation   Medicines ordered and prescription drug management: Meds ordered this encounter  Medications   ketorolac  (TORADOL ) 15 MG/ML injection 15 mg   diazepam  (VALIUM ) injection 5 mg   oxyCODONE  (Oxy IR/ROXICODONE ) immediate release tablet 5 mg    Refill:  0   ketorolac  (TORADOL ) 30 MG/ML injection 15 mg   lidocaine  (LIDODERM ) 5 % 1 patch   amoxicillin -clavulanate (AUGMENTIN ) 875-125 MG tablet    Sig: Take 1 tablet by mouth every 12 (twelve) hours.    Dispense:  14 tablet    Refill:  0   ibuprofen  (ADVIL ) 600 MG tablet    Sig: Take 1 tablet (600 mg total) by mouth every 6 (six) hours as needed.    Dispense:  30 tablet    Refill:  0   lidocaine  (LIDODERM ) 5 %    Sig: Place 1 patch onto the skin daily. 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   Diverticulitis of colon with perforation 03/05/2022   Diverticulitis of intestine with perforation 03/05/2022   Dyspnea on exertion 01/14/2023   Elevated BP without diagnosis of hypertension 03/05/2022   Essential hypertension 01/14/2023   GERD (gastroesophageal reflux disease)    occ with spicy foods    Heavy alcohol consumption 03/05/2022   Internal prolapsed hemorrhoids 03/05/2022   Syncope and collapse 01/14/2023   Tobacco abuse 03/05/2022   Uncontrolled hypertension 03/05/2022      Dispostion: Disposition decision including need for hospitalization was considered, and patient discharged from emergency department.    Final Clinical Impression(s) / ED Diagnoses Final diagnoses:  Acute left-sided low back pain with left-sided sciatica  Diverticulitis     This chart was dictated using voice recognition software.  Despite best efforts to proofread,  errors can occur which can change the documentation meaning.    Francesca Elsie CROME, MD 07/14/24 1346

## 2024-07-14 NOTE — ED Triage Notes (Signed)
 Pt with severe back and left leg pain. Was seen by him primary doctor last Friday, was given Steroid taper. Pain now worse and shooting down his leg. Had a followup appt today, unable to sleep last night, was told by his primary to come on to the ER.

## 2024-07-14 NOTE — Discharge Instructions (Addendum)
 We evaluated you for your back pain.  Your examination was reassuring.  We suspect that you may have sciatica, which is caused by a pinched nerve.  We would recommend that you follow-up with your primary doctor as you may need an MRI of your back.  We did obtain a CT scan of your belly to check whether anything else was causing your pain.  It did show a small area of diverticulitis.  We have given you prescription for antibiotics.  We do not think this is the cause of your back pain, but since you have a history of diverticulitis we feel the safest thing is to treat this.  We would recommend that you continue to take your Norco as needed.  We have also prescribed you ibuprofen .  We feel that this is safe to take in the short-term.  You can take 600 mg every 6 hours.  Also prescribed you lidocaine  patches.  Try to stretch your back as much as you can.  If your pain is persistent you may need to see physical therapy.  Please discuss this with your primary physician.  Please return if you have any new or worsening symptoms such as inability to walk, weakness in your legs, bowel or bladder incontinence, fevers, abdominal pain, or any other new symptoms.

## 2024-07-19 ENCOUNTER — Encounter: Payer: Self-pay | Admitting: Medical

## 2024-07-19 ENCOUNTER — Ambulatory Visit (INDEPENDENT_AMBULATORY_CARE_PROVIDER_SITE_OTHER): Admitting: Medical

## 2024-07-19 VITALS — BP 140/85 | HR 95 | Temp 98.2°F | Resp 16 | Ht 68.0 in | Wt 179.4 lb

## 2024-07-19 DIAGNOSIS — K5792 Diverticulitis of intestine, part unspecified, without perforation or abscess without bleeding: Secondary | ICD-10-CM

## 2024-07-19 DIAGNOSIS — I10 Essential (primary) hypertension: Secondary | ICD-10-CM

## 2024-07-19 DIAGNOSIS — G8929 Other chronic pain: Secondary | ICD-10-CM | POA: Diagnosis not present

## 2024-07-19 DIAGNOSIS — M545 Low back pain, unspecified: Secondary | ICD-10-CM

## 2024-07-19 NOTE — Patient Instructions (Signed)
 Chronic low back pain with degenerative disc disease and suspected left-sided sciatica Chronic low back pain with degenerative disc disease at L3 through S1 and suspected left-sided sciatica. Previous treatment improved symptoms. Occasional mild pain persists, with severe flares monthly. Avoids ibuprofen  due to hypertension. - Continue Tylenol  as needed. - Use Flexeril  at night for moderate to severe pain flares. - Perform back stretching exercises. - Use elastic support belt on pain flare days. - Consider MRI if pain flares with leg radiation. - Consider sports medicine referral if frequent flares.  Essential hypertension Essential hypertension managed with hydralazine , losartan , and metoprolol . Current blood pressure elevated at 150/95. - Monitor blood pressure at home. - Consider increasing hydralazine  to 50 mg three times a day if blood pressure remains elevated. - Maintain current regimen if blood pressure drops to 140/90 or less on repeat checks. Check later today and give me update.   History of diverticulitis Recent diverticulitis flare treated with Augmentin , resulting in symptom improvement. Current no pain on exam. - Monitor for recurrence of symptoms. - Consider further evaluation if symptoms reoccur  Follow up date to be determined after bp update later today  Back Exercises These exercises help to make your trunk and back strong. They also help to keep the lower back flexible. Doing these exercises can help to prevent or lessen pain in your lower back. If you have back pain, try to do these exercises 2-3 times each day or as told by your doctor. As you get better, do the exercises once each day. Repeat the exercises more often as told by your doctor. To stop back pain from coming back, do the exercises once each day, or as told by your doctor. Do exercises exactly as told by your doctor. Stop right away if you feel sudden pain or your pain gets worse. Exercises Single knee  to chest Do these steps 3-5 times in a row for each leg: Lie on your back on a firm bed or the floor with your legs stretched out. Bring one knee to your chest. Grab your knee or thigh with both hands and hold it in place. Pull on your knee until you feel a gentle stretch in your lower back or butt. Keep doing the stretch for 10-30 seconds. Slowly let go of your leg and straighten it. Pelvic tilt Do these steps 5-10 times in a row: Lie on your back on a firm bed or the floor with your legs stretched out. Bend your knees so they point up to the ceiling. Your feet should be flat on the floor. Tighten your lower belly (abdomen) muscles to press your lower back against the floor. This will make your tailbone point up to the ceiling instead of pointing down to your feet or the floor. Stay in this position for 5-10 seconds while you gently tighten your muscles and breathe evenly. Cat-cow Do these steps until your lower back bends more easily: Get on your hands and knees on a firm bed or the floor. Keep your hands under your shoulders, and keep your knees under your hips. You may put padding under your knees. Let your head hang down toward your chest. Tighten (contract) the muscles in your belly. Point your tailbone toward the floor so your lower back becomes rounded like the back of a cat. Stay in this position for 5 seconds. Slowly lift your head. Let the muscles of your belly relax. Point your tailbone up toward the ceiling so your back forms a sagging  arch like the back of a cow. Stay in this position for 5 seconds.  Press-ups Do these steps 5-10 times in a row: Lie on your belly (face-down) on a firm bed or the floor. Place your hands near your head, about shoulder-width apart. While you keep your back relaxed and keep your hips on the floor, slowly straighten your arms to raise the top half of your body and lift your shoulders. Do not use your back muscles. You may change where you place  your hands to make yourself more comfortable. Stay in this position for 5 seconds. Keep your back relaxed. Slowly return to lying flat on the floor.  Bridges Do these steps 10 times in a row: Lie on your back on a firm bed or the floor. Bend your knees so they point up to the ceiling. Your feet should be flat on the floor. Your arms should be flat at your sides, next to your body. Tighten your butt muscles and lift your butt off the floor until your waist is almost as high as your knees. If you do not feel the muscles working in your butt and the back of your thighs, slide your feet 1-2 inches (2.5-5 cm) farther away from your butt. Stay in this position for 3-5 seconds. Slowly lower your butt to the floor, and let your butt muscles relax. If this exercise is too easy, try doing it with your arms crossed over your chest. Belly crunches Do these steps 5-10 times in a row: Lie on your back on a firm bed or the floor with your legs stretched out. Bend your knees so they point up to the ceiling. Your feet should be flat on the floor. Cross your arms over your chest. Tip your chin a little bit toward your chest, but do not bend your neck. Tighten your belly muscles and slowly raise your chest just enough to lift your shoulder blades a tiny bit off the floor. Avoid raising your body higher than that because it can put too much stress on your lower back. Slowly lower your chest and your head to the floor. Back lifts Do these steps 5-10 times in a row: Lie on your belly (face-down) with your arms at your sides, and rest your forehead on the floor. Tighten the muscles in your legs and your butt. Slowly lift your chest off the floor while you keep your hips on the floor. Keep the back of your head in line with the curve in your back. Look at the floor while you do this. Stay in this position for 3-5 seconds. Slowly lower your chest and your face to the floor. Contact a doctor if: Your back pain  gets a lot worse when you do an exercise. Your back pain does not get better within 2 hours after you exercise. If you have any of these problems, stop doing the exercises. Do not do them again unless your doctor says it is okay. Get help right away if: You have sudden, very bad back pain. If this happens, stop doing the exercises. Do not do them again unless your doctor says it is okay. This information is not intended to replace advice given to you by your health care provider. Make sure you discuss any questions you have with your health care provider. Document Revised: 01/15/2021 Document Reviewed: 01/15/2021 Elsevier Patient Education  2024 [employer].

## 2024-07-19 NOTE — Progress Notes (Signed)
 Subjective:    Patient ID: Christian Peterson, male    DOB: Mar 17, 1971, 53 y.o.   MRN: [id number]  HPI  Pt seen by ED recently on 07/14/2024  Christian Peterson is a 53 y.o. male history of hypertension, chronic back pain presents to the emergency department back pain.  Patient reports back pain for months, has been worse over the past week.  Reports saw his primary doctor about a week ago and was given a dose of steroids and some Norco.  Reports that pain got worse last night.  Radiates into the left leg, has some tingling in the left foot.  No bowel or bladder incontinence.  No recent trauma.  No history of IV drug use.  No fevers or chills.  Able to walk.  No other numbness or tingling.  No groin numbness or tingling.  Is only taking Norco and Tylenol .     Pt had ct done of abdomen.  IMPRESSION: 1. Mild focal proximal sigmoid diverticulitis is noted without abscess formation. 2. Small nonobstructive left renal calculus. 3. Aortic atherosclerosis. Aortic Atherosclerosis    MDM   Patient overall well-appearing.  uncomfortable due to back pain.  Neurologic exam is reassuring.   Suspect possible sciatica given radiation down into the left leg.  Considered other process such as fracture but no recent trauma.  Considered spinal cord compression from infection or injury but neurologic exam is reassuring, patient denies fevers or IVDU.  Differential also includes musculoskeletal process.  Considered cauda equina syndrome but no weakness, bowel or bladder incontinence..  Patient had appointment for follow-up with his primary doctor today but they apparently had him come to the ER instead.  I do not think further imaging or medical workup is indicated at this time.  Will attempt to control symptoms and anticipate discharge    Pt initially did get better with back pain before he had llq pain and possible sciatica/hip area pain for which he went to ED. See below my A/P last visit.  Chronic low back  pain with acute exacerbation Chronic low back pain exacerbated by recent events, severe and unresponsive to acetaminophen . No neurological red flags present. - Order lumbar spine x-ray. - Prescribe Medrol  6-day taper (4 mg). - Prescribe Flexeril  5 mg at night. - Prescribe Norco 5/325 mg, 12 tablets, every 8 hours as needed. - Follow up in 7 days   Christian Peterson is a 53 year old male with diverticulitis and chronic back pain who presents with hip and back pain. He went to ED   He initially experienced back pain, which progressed to shooting pains down his leg starting on a Thursday night. The pain was severe enough that he considered going to the emergency department the following morning. His wife called the nurse for advice, and he was advised to go to the emergency department due to the intensity of the pain.  In the emergency department, a CT scan was performed, which did not reveal a kidney stone. However, it showed signs of proximal sigmoid diverticulitis. He was prescribed Augmentin  875 mg twice daily for seven days, with two doses remaining. The pain in his hip and left lower quadrant improved slightly with the antibiotic, though he still experiences some discomfort when twisting or rolling over in bed.  He has a history of diverticulitis and recalls previous flares. He was also given ibuprofen  for inflammation, which he has not taken due to concerns about his blood pressure.  Regarding his back pain,  he has a long-standing history of chronic back issues, with mild pain occurring daily and severe flare-ups about once a month. He was previously prescribed a Medrol  six-day taper, Flexeril , and Norco, which improved his back pain. He has two Norco tablets remaining and occasionally experiences pain when getting up from sitting. He avoids ibuprofen  due to its impact on his blood pressure, which is managed with hydralazine , losartan , and metoprolol .  In the past he has used  Tylenol   for back  pain and occasional use of Flexeril . He has not taken the muscle relaxant recently but has some remaining for use during flares. Occasional back pain, especially when getting up from sitting.   Review of Systems  Constitutional:  Negative for chills, fatigue and fever.  Respiratory:  Negative for cough, chest tightness and wheezing.   Cardiovascular:  Negative for chest pain and palpitations.  Gastrointestinal:  Negative for abdominal pain, blood in stool and constipation.  Genitourinary:  Negative for dysuria, flank pain and genital sores.  Musculoskeletal:  Negative for back pain and myalgias.  Neurological:  Negative for dizziness, syncope, weakness and light-headedness.  Hematological:  Negative for adenopathy. Does not bruise/bleed easily.  Psychiatric/Behavioral:  Negative for behavioral problems and dysphoric mood. The patient is not nervous/anxious.            Objective:   Physical Exam  General Appearance- Not in acute distress.    Chest and Lung Exam Auscultation: Breath sounds:-Normal. Clear even and unlabored. Adventitious sounds:- No Adventitious sounds.  Cardiovascular Auscultation:Rythm - Regular, rate and rythm. Heart Sounds -Normal heart sounds.  Abdomen Inspection:-Inspection Normal.  Palpation/Perucssion: Palpation and Percussion of the abdomen reveal- Non Tender, No Rebound tenderness, No rigidity(Guarding) and No Palpable abdominal masses.  Liver:-Normal.  Spleen:- Normal.   Back No mid lumbar spine tenderness to palpation. No pain on straight leg lift. No pain on lateral movements and flexion/extension of the spine.  Lower ext neurologic  L5-S1 sensation intact bilaterally. Normal patellar reflexes bilaterally. No foot drop bilaterally.       Assessment & Plan:   Patient Instructions  Chronic low back pain with degenerative disc disease and suspected left-sided sciatica Chronic low back pain with degenerative disc disease at L3 through S1  and suspected left-sided sciatica. Previous treatment improved symptoms. Occasional mild pain persists, with severe flares monthly. Avoids ibuprofen  due to hypertension. - Continue Tylenol  as needed. - Use Flexeril  at night for moderate to severe pain flares. - Perform back stretching exercises. - Use elastic support belt on pain flare days. - Consider MRI if pain flares with leg radiation. - Consider sports medicine referral if frequent flares.  Essential hypertension Essential hypertension managed with hydralazine , losartan , and metoprolol . Current blood pressure elevated at 150/95. - Monitor blood pressure at home. - Consider increasing hydralazine  to 50 mg three times a day if blood pressure remains elevated. - Maintain current regimen if blood pressure drops to 140/90 or less on repeat checks. Check later today and give me update.   History of diverticulitis Recent diverticulitis flare treated with Augmentin , resulting in symptom improvement. Current no pain on exam. - Monitor for recurrence of symptoms. - Consider further evaluation if symptoms reoccur  Follow up date to be determined after bp update later today  Back Exercises These exercises help to make your trunk and back strong. They also help to keep the lower back flexible. Doing these exercises can help to prevent or lessen pain in your lower back. If you have back pain, try to  do these exercises 2-3 times each day or as told by your doctor. As you get better, do the exercises once each day. Repeat the exercises more often as told by your doctor. To stop back pain from coming back, do the exercises once each day, or as told by your doctor. Do exercises exactly as told by your doctor. Stop right away if you feel sudden pain or your pain gets worse. Exercises Single knee to chest Do these steps 3-5 times in a row for each leg: Lie on your back on a firm bed or the floor with your legs stretched out. Bring one knee to your  chest. Grab your knee or thigh with both hands and hold it in place. Pull on your knee until you feel a gentle stretch in your lower back or butt. Keep doing the stretch for 10-30 seconds. Slowly let go of your leg and straighten it. Pelvic tilt Do these steps 5-10 times in a row: Lie on your back on a firm bed or the floor with your legs stretched out. Bend your knees so they point up to the ceiling. Your feet should be flat on the floor. Tighten your lower belly (abdomen) muscles to press your lower back against the floor. This will make your tailbone point up to the ceiling instead of pointing down to your feet or the floor. Stay in this position for 5-10 seconds while you gently tighten your muscles and breathe evenly. Cat-cow Do these steps until your lower back bends more easily: Get on your hands and knees on a firm bed or the floor. Keep your hands under your shoulders, and keep your knees under your hips. You may put padding under your knees. Let your head hang down toward your chest. Tighten (contract) the muscles in your belly. Point your tailbone toward the floor so your lower back becomes rounded like the back of a cat. Stay in this position for 5 seconds. Slowly lift your head. Let the muscles of your belly relax. Point your tailbone up toward the ceiling so your back forms a sagging arch like the back of a cow. Stay in this position for 5 seconds.  Press-ups Do these steps 5-10 times in a row: Lie on your belly (face-down) on a firm bed or the floor. Place your hands near your head, about shoulder-width apart. While you keep your back relaxed and keep your hips on the floor, slowly straighten your arms to raise the top half of your body and lift your shoulders. Do not use your back muscles. You may change where you place your hands to make yourself more comfortable. Stay in this position for 5 seconds. Keep your back relaxed. Slowly return to lying flat on the  floor.  Bridges Do these steps 10 times in a row: Lie on your back on a firm bed or the floor. Bend your knees so they point up to the ceiling. Your feet should be flat on the floor. Your arms should be flat at your sides, next to your body. Tighten your butt muscles and lift your butt off the floor until your waist is almost as high as your knees. If you do not feel the muscles working in your butt and the back of your thighs, slide your feet 1-2 inches (2.5-5 cm) farther away from your butt. Stay in this position for 3-5 seconds. Slowly lower your butt to the floor, and let your butt muscles relax. If this exercise is too easy, try  doing it with your arms crossed over your chest. Belly crunches Do these steps 5-10 times in a row: Lie on your back on a firm bed or the floor with your legs stretched out. Bend your knees so they point up to the ceiling. Your feet should be flat on the floor. Cross your arms over your chest. Tip your chin a little bit toward your chest, but do not bend your neck. Tighten your belly muscles and slowly raise your chest just enough to lift your shoulder blades a tiny bit off the floor. Avoid raising your body higher than that because it can put too much stress on your lower back. Slowly lower your chest and your head to the floor. Back lifts Do these steps 5-10 times in a row: Lie on your belly (face-down) with your arms at your sides, and rest your forehead on the floor. Tighten the muscles in your legs and your butt. Slowly lift your chest off the floor while you keep your hips on the floor. Keep the back of your head in line with the curve in your back. Look at the floor while you do this. Stay in this position for 3-5 seconds. Slowly lower your chest and your face to the floor. Contact a doctor if: Your back pain gets a lot worse when you do an exercise. Your back pain does not get better within 2 hours after you exercise. If you have any of these  problems, stop doing the exercises. Do not do them again unless your doctor says it is okay. Get help right away if: You have sudden, very bad back pain. If this happens, stop doing the exercises. Do not do them again unless your doctor says it is okay. This information is not intended to replace advice given to you by your health care provider. Make sure you discuss any questions you have with your health care provider. Document Revised: 01/15/2021 Document Reviewed: 01/15/2021 [employer] Patient Education  2024 [employer].

## 2024-07-20 ENCOUNTER — Encounter: Payer: Self-pay | Admitting: Medical

## 2024-09-16 ENCOUNTER — Other Ambulatory Visit: Payer: Self-pay | Admitting: Medical

## 2024-09-16 ENCOUNTER — Other Ambulatory Visit: Payer: Self-pay | Admitting: Gastroenterology

## 2024-12-22 ENCOUNTER — Other Ambulatory Visit: Payer: Self-pay | Admitting: Medical
